# Patient Record
Sex: Female | Born: 1975
Health system: Southern US, Community
[De-identification: ages and names within clinical notes are randomized; demographics above are authoritative.]

## PROBLEM LIST (undated history)

## (undated) DIAGNOSIS — N200 Calculus of kidney: Secondary | ICD-10-CM

## (undated) HISTORY — PX: BREAST SURGERY: SHX581

## (undated) HISTORY — PX: ABDOMINAL HYSTERECTOMY: SHX81

## (undated) HISTORY — PX: LITHOTRIPSY: SUR834

---

## 1998-07-27 ENCOUNTER — Encounter: Payer: Self-pay | Admitting: Emergency Medicine

## 1998-07-27 ENCOUNTER — Emergency Department (HOSPITAL_COMMUNITY): Admission: EM | Admit: 1998-07-27 | Discharge: 1998-07-27 | Payer: Self-pay

## 1998-09-04 ENCOUNTER — Other Ambulatory Visit: Admission: RE | Admit: 1998-09-04 | Discharge: 1998-09-04 | Payer: Self-pay | Admitting: Obstetrics

## 1998-11-02 ENCOUNTER — Emergency Department (HOSPITAL_COMMUNITY): Admission: EM | Admit: 1998-11-02 | Discharge: 1998-11-02 | Payer: Self-pay | Admitting: Emergency Medicine

## 1999-03-20 ENCOUNTER — Emergency Department (HOSPITAL_COMMUNITY): Admission: EM | Admit: 1999-03-20 | Discharge: 1999-03-20 | Payer: Self-pay | Admitting: Emergency Medicine

## 1999-07-19 ENCOUNTER — Emergency Department (HOSPITAL_COMMUNITY): Admission: EM | Admit: 1999-07-19 | Discharge: 1999-07-19 | Payer: Self-pay | Admitting: Emergency Medicine

## 1999-08-10 ENCOUNTER — Ambulatory Visit (HOSPITAL_COMMUNITY): Admission: RE | Admit: 1999-08-10 | Discharge: 1999-08-10 | Payer: Self-pay | Admitting: Family Medicine

## 1999-08-10 ENCOUNTER — Encounter: Payer: Self-pay | Admitting: Family Medicine

## 1999-11-15 ENCOUNTER — Encounter: Payer: Self-pay | Admitting: Emergency Medicine

## 1999-11-15 ENCOUNTER — Emergency Department (HOSPITAL_COMMUNITY): Admission: EM | Admit: 1999-11-15 | Discharge: 1999-11-15 | Payer: Self-pay | Admitting: Emergency Medicine

## 2001-09-19 ENCOUNTER — Emergency Department (HOSPITAL_COMMUNITY): Admission: EM | Admit: 2001-09-19 | Discharge: 2001-09-19 | Payer: Self-pay | Admitting: Emergency Medicine

## 2002-04-15 ENCOUNTER — Emergency Department (HOSPITAL_COMMUNITY): Admission: EM | Admit: 2002-04-15 | Discharge: 2002-04-15 | Payer: Self-pay | Admitting: Emergency Medicine

## 2002-08-29 ENCOUNTER — Emergency Department (HOSPITAL_COMMUNITY): Admission: EM | Admit: 2002-08-29 | Discharge: 2002-08-29 | Payer: Self-pay | Admitting: Emergency Medicine

## 2003-02-23 ENCOUNTER — Emergency Department (HOSPITAL_COMMUNITY): Admission: EM | Admit: 2003-02-23 | Discharge: 2003-02-23 | Payer: Self-pay | Admitting: Emergency Medicine

## 2003-03-14 ENCOUNTER — Other Ambulatory Visit: Admission: RE | Admit: 2003-03-14 | Discharge: 2003-03-14 | Payer: Self-pay | Admitting: Obstetrics and Gynecology

## 2003-09-19 ENCOUNTER — Emergency Department (HOSPITAL_COMMUNITY): Admission: EM | Admit: 2003-09-19 | Discharge: 2003-09-19 | Payer: Self-pay | Admitting: Emergency Medicine

## 2003-10-30 ENCOUNTER — Ambulatory Visit (HOSPITAL_COMMUNITY): Admission: RE | Admit: 2003-10-30 | Discharge: 2003-10-30 | Payer: Self-pay | Admitting: Obstetrics and Gynecology

## 2006-01-28 ENCOUNTER — Emergency Department: Payer: Self-pay | Admitting: Emergency Medicine

## 2006-01-30 ENCOUNTER — Ambulatory Visit: Payer: Self-pay | Admitting: Emergency Medicine

## 2006-05-12 ENCOUNTER — Emergency Department: Payer: Self-pay | Admitting: Emergency Medicine

## 2006-07-24 ENCOUNTER — Emergency Department: Payer: Self-pay | Admitting: Emergency Medicine

## 2006-10-29 ENCOUNTER — Emergency Department: Payer: Self-pay | Admitting: Emergency Medicine

## 2007-05-29 IMAGING — CT CT STONE STUDY
1 of 2 series · 15 of 32 positions shown, 19 images · non-contrast
Comparison: none

REASON FOR EXAM: flank pain       rm 1
COMMENTS:  LMP: > one month ago

[Series 2: soft tissue · axial · 0.63mm/px · z∈[-951,-579]mm · 15 of 141 slices shown, 19 images]
[im 11/141  soft-tissue]
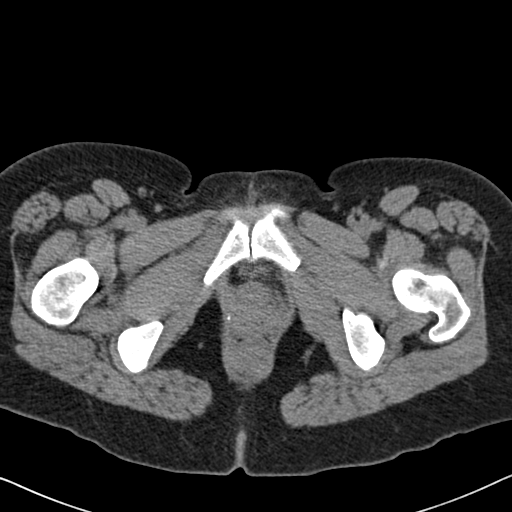
[im 11/141  bone]
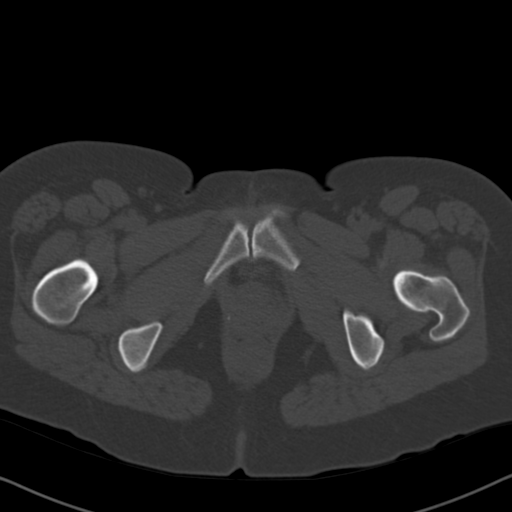
[im 21/141  soft-tissue]
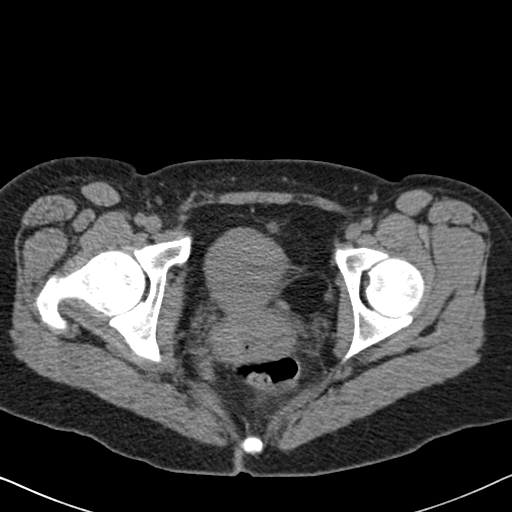
[im 32/141  soft-tissue]
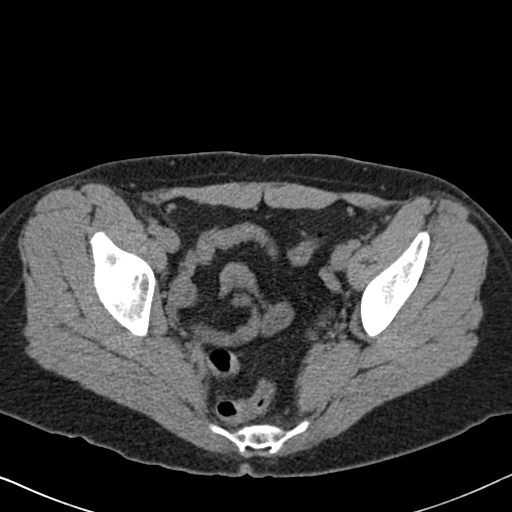
[im 42/141  soft-tissue]
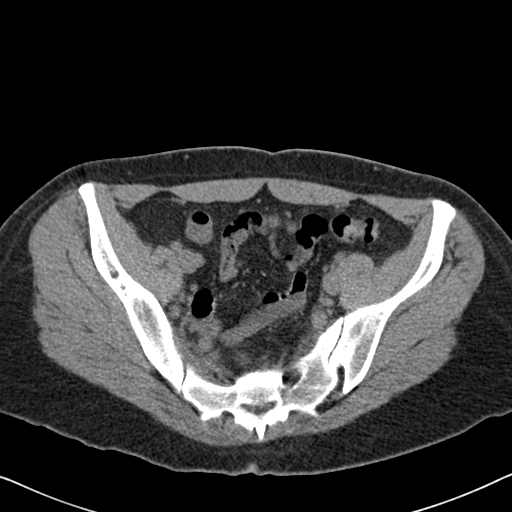
[im 52/141  soft-tissue]
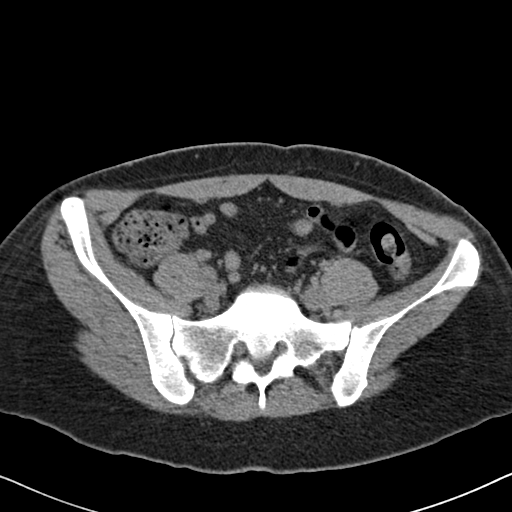
[im 63/141  soft-tissue]
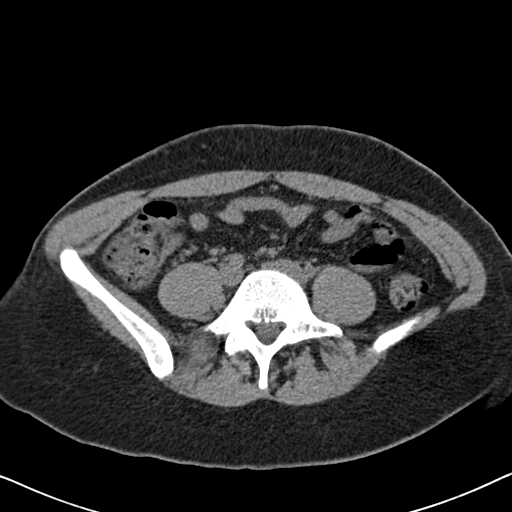
[im 73/141  soft-tissue]
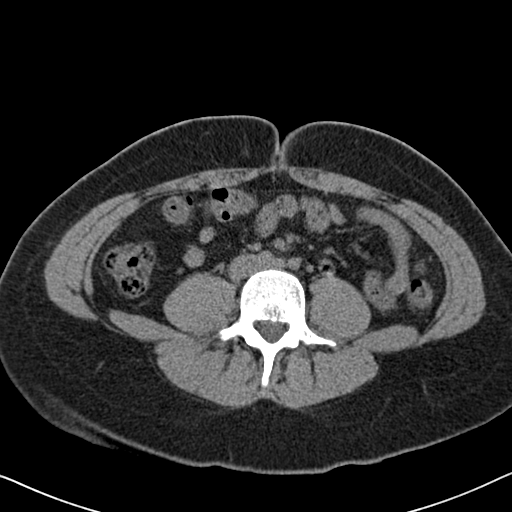
[im 83/141  soft-tissue]
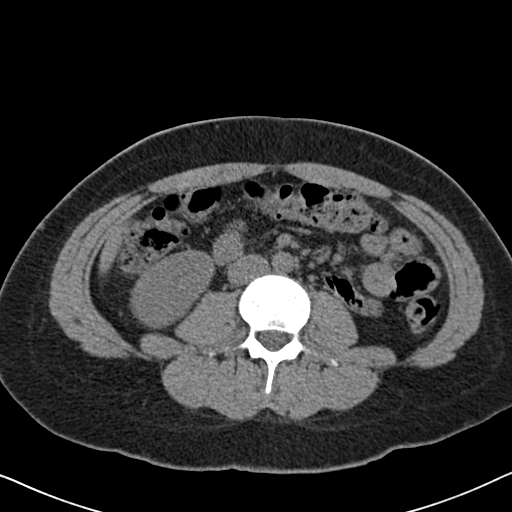
[im 94/141  soft-tissue]
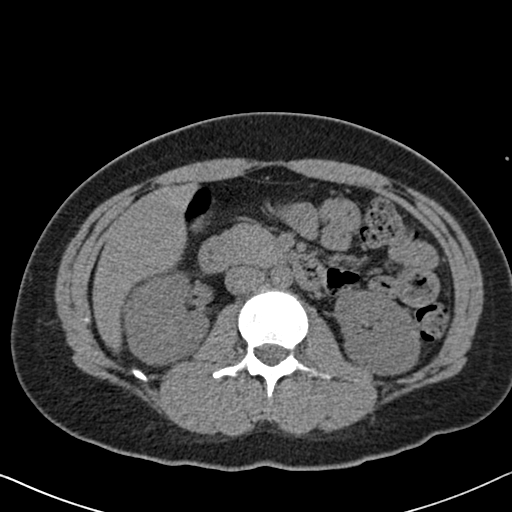
[im 94/141  bone]
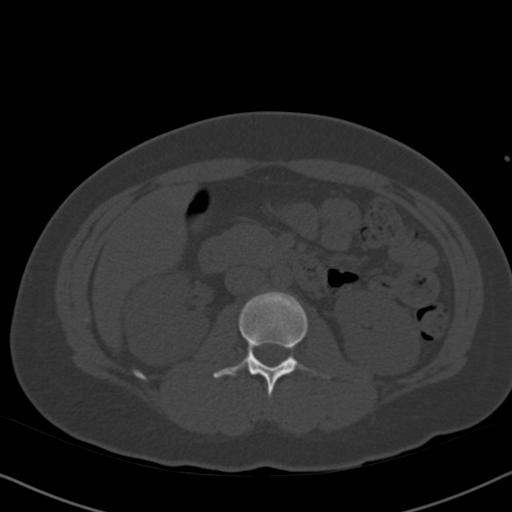
[im 104/141  soft-tissue]
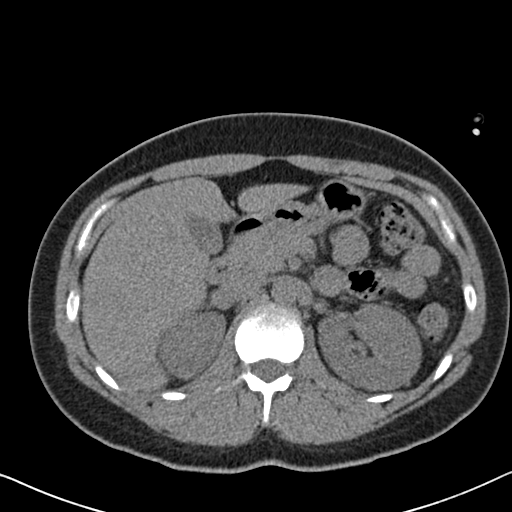
[im 115/141  soft-tissue]
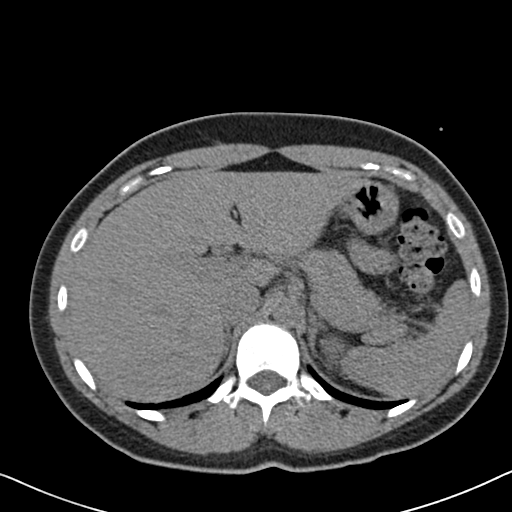
[im 120/141  lung]
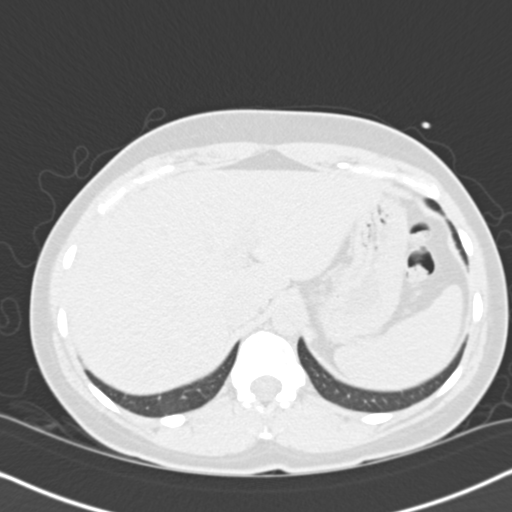
[im 125/141  soft-tissue]
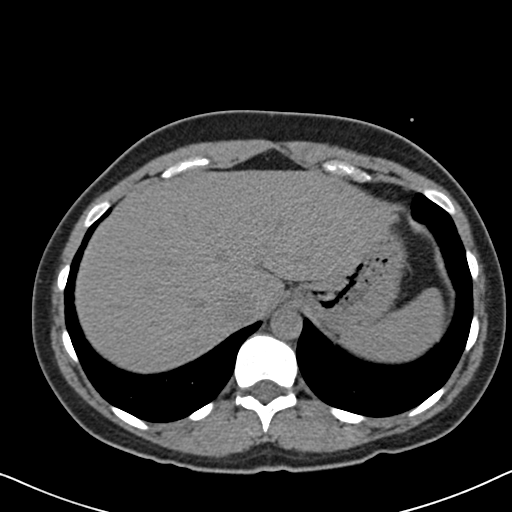
[im 125/141  lung]
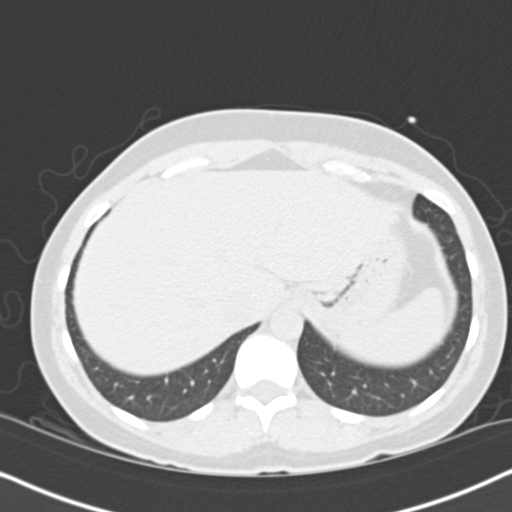
[im 130/141  lung]
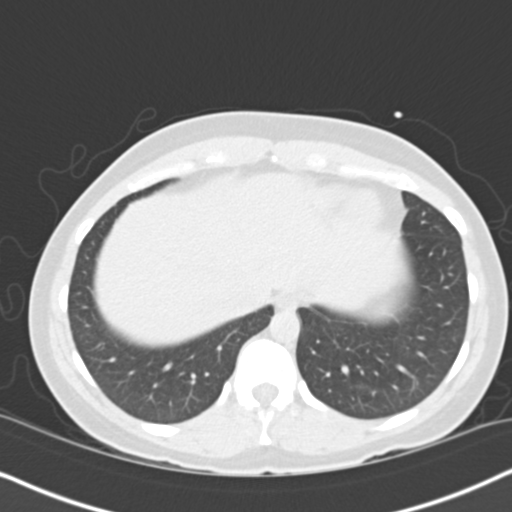
[im 135/141  soft-tissue]
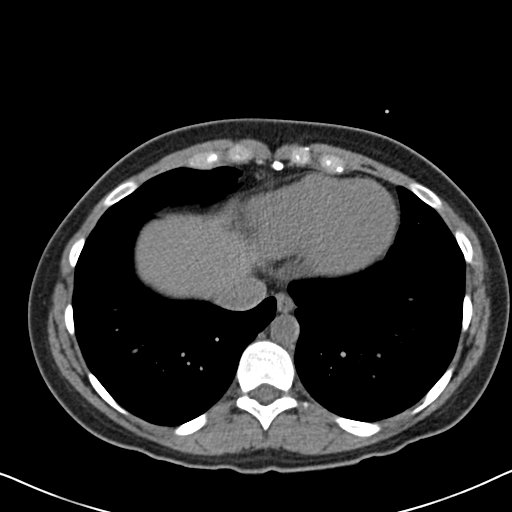
[im 135/141  lung]
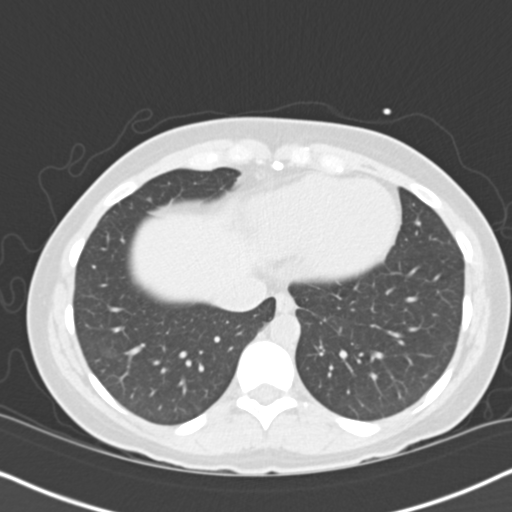

[15 of 32 positions shown; findings below may reference images not displayed]

PROCEDURE:     CT  - CT ABDOMEN /PELVIS WO (STONE)  - May 12, 2006 [DATE]

RESULT:     The patient is complaining of LEFT flank discomfort.  Comparison
is made to study 30 January, 2006.

Both RIGHT and LEFT kidneys are normal in contour. There are faint medullary
calcifications in both kidneys and there is a lower pole stone seen on image
#51 which was demonstrated on the prior study.  Neither kidney exhibits
hydronephrosis.  The perinephric fat is normal in appearance.  The
unopacified loops of small and large bowel are normal in appearance. There
is no free fluid in the abdomen or pelvis.  The uterus and adnexal
structures are grossly normal. The partially distended urinary bladder is
also normal in appearance. The periaortic and pericaval regions are normal
in appearance.

The liver, gallbladder, pancreas, spleen, adrenal glands, and non-distended
stomach are normal in appearance. The lung bases are clear.
IMPRESSION: 1)I do not see evidence of urinary tract obstruction currently.  There is an
approximately 2 to 3 mm diameter lower pole caliceal stone on the LEFT,
which was seen on the prior exam.  There are no findings to suggest
perinephric inflammatory change either.

2)I do not see evidence of bowel obstruction nor findings suspicious for
inflammatory changes.

3)There is no free fluid in the abdomen or pelvis.

4)There is no evidence of acute hepatobiliary disease.

The findings were called to the [HOSPITAL] the conclusion of
the study.

## 2008-01-07 ENCOUNTER — Ambulatory Visit: Payer: Self-pay | Admitting: Family Medicine

## 2008-01-24 ENCOUNTER — Encounter: Payer: Self-pay | Admitting: Family Medicine

## 2008-01-30 ENCOUNTER — Encounter: Payer: Self-pay | Admitting: Family Medicine

## 2008-02-29 ENCOUNTER — Encounter: Payer: Self-pay | Admitting: Family Medicine

## 2009-08-04 ENCOUNTER — Emergency Department: Payer: Self-pay | Admitting: Emergency Medicine

## 2009-08-08 ENCOUNTER — Other Ambulatory Visit: Payer: Self-pay | Admitting: Family Medicine

## 2010-10-31 HISTORY — PX: BREAST CYST EXCISION: SHX579

## 2010-11-01 ENCOUNTER — Emergency Department: Payer: Self-pay | Admitting: Emergency Medicine

## 2010-11-15 ENCOUNTER — Emergency Department: Payer: Self-pay | Admitting: Emergency Medicine

## 2011-03-30 ENCOUNTER — Ambulatory Visit: Payer: Self-pay | Admitting: Family

## 2011-03-31 ENCOUNTER — Ambulatory Visit: Payer: Self-pay | Admitting: Family

## 2011-06-23 ENCOUNTER — Ambulatory Visit: Payer: Self-pay | Admitting: General Surgery

## 2011-06-24 LAB — PATHOLOGY REPORT

## 2012-03-30 ENCOUNTER — Ambulatory Visit: Payer: Self-pay | Admitting: General Surgery

## 2012-04-15 IMAGING — US ULTRASOUND LEFT BREAST
1 series · 11 of 11 positions shown · non-contrast
Comparison: none

REASON FOR EXAM: LT BRST PAIN NIPPLE AREA YEARLY US IF NEEDED
COMMENTS:

PROCEDURE:     US  - US BREAST LEFT  - March 30, 2011  [DATE]
RESULT:     No dominant masses or cysts are noted.

[Series 1: ultrasound left breast · 11 of 11 slices shown]
[im 1/11]
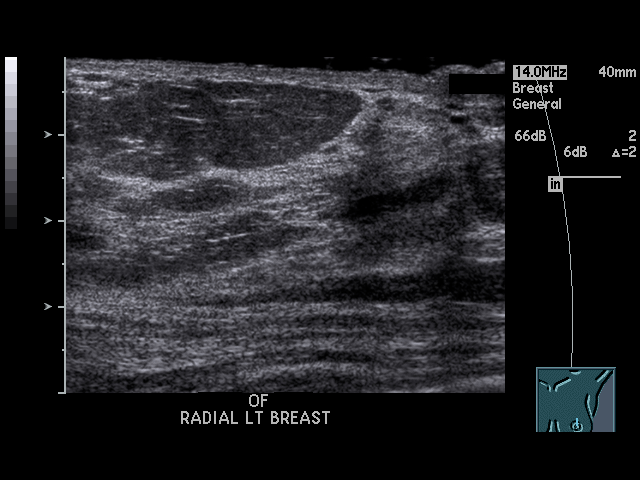
[im 2/11]
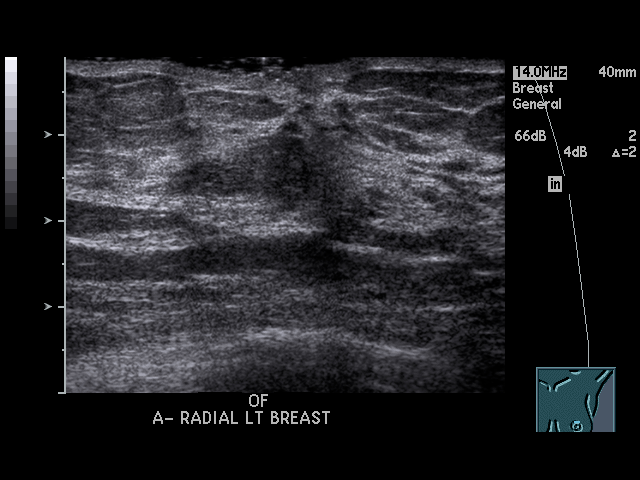
[im 3/11]
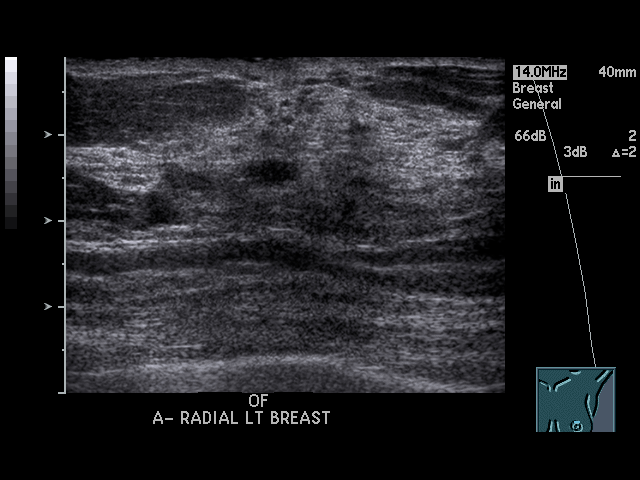
[im 4/11]
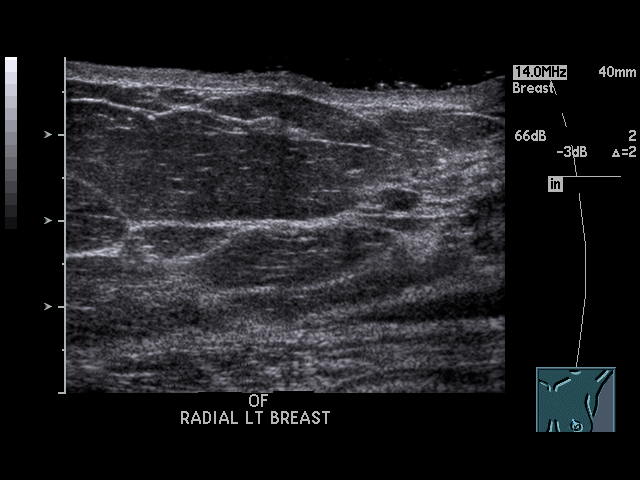
[im 5/11]
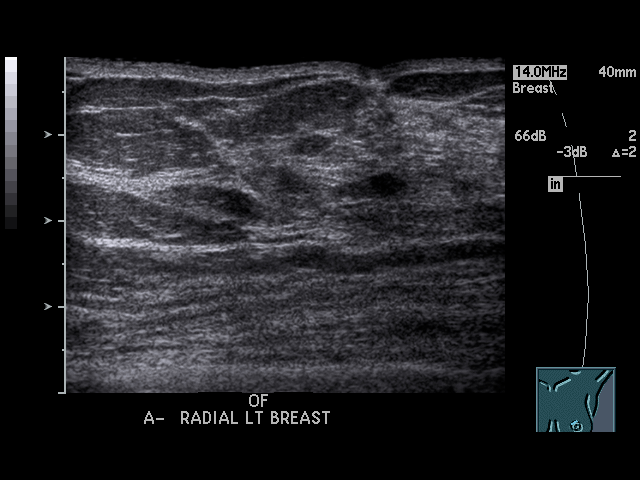
[im 6/11]
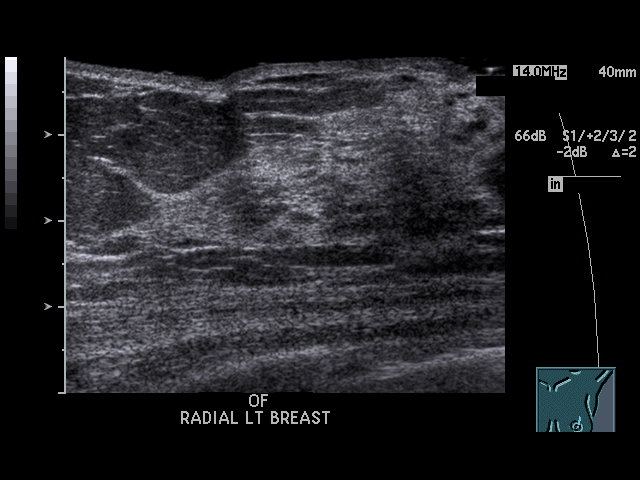
[im 7/11]
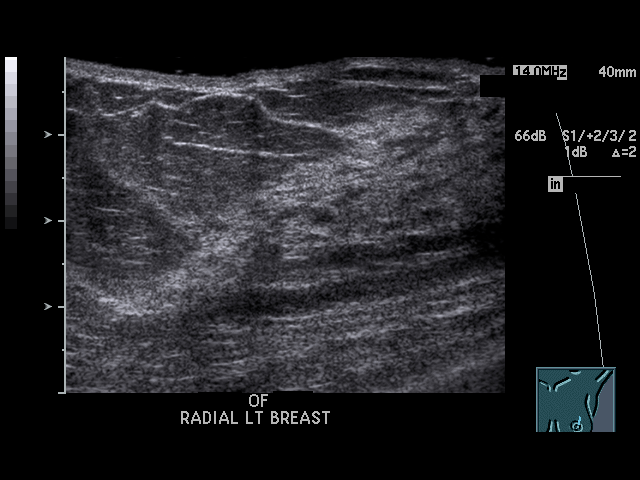
[im 8/11]
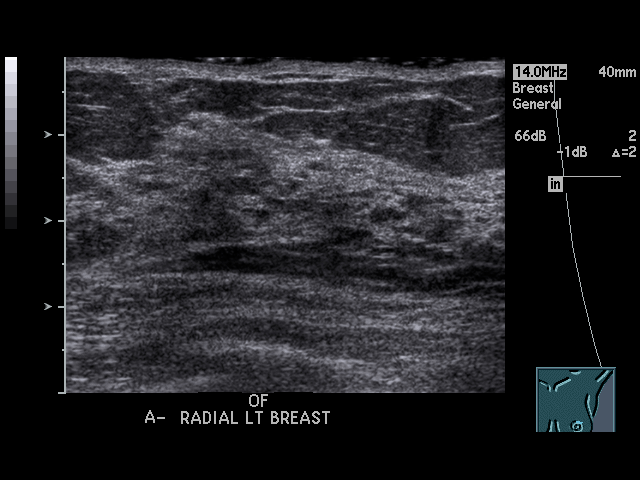
[im 9/11]
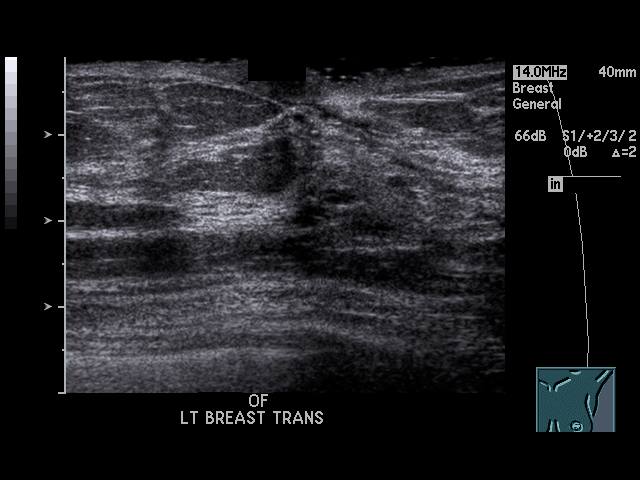
[im 10/11]
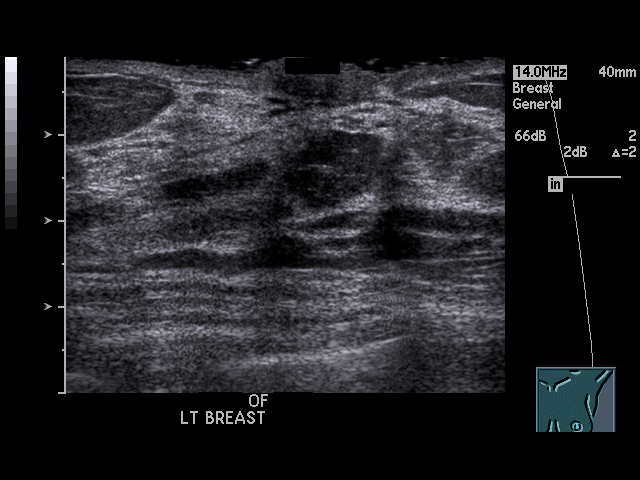
[im 11/11]
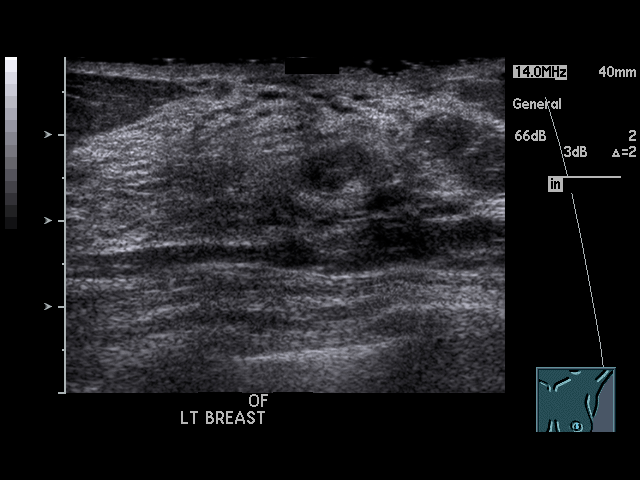

[11 of 11 positions shown; findings below may reference images not displayed]

IMPRESSION: Negative left breast ultrasound. Reference is made to mammogram report.

## 2012-04-15 IMAGING — MG MAM DGTL DIAGNOSTIC MAMMO W/CAD
1 series · 8 of 8 positions shown · non-contrast
Comparison: none

REASON FOR EXAM: LT BRST PAIN NIPPLE AREA YEARLY US IF NEEDED
COMMENTS:

[Series 448: R CC · right · 8 of 8 slices shown]
[im 1/8]
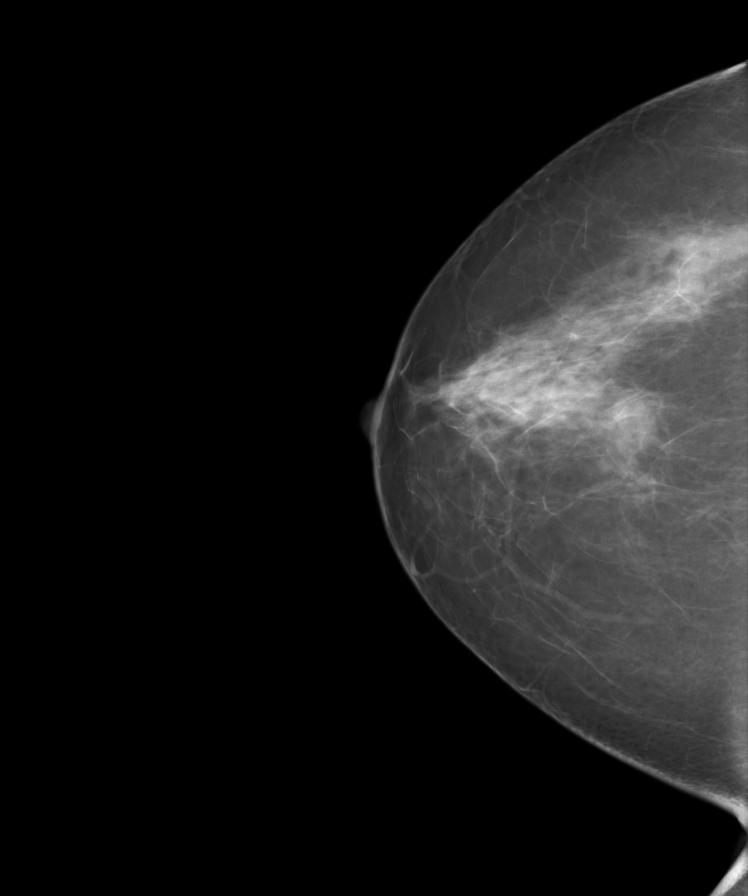
[im 2/8]
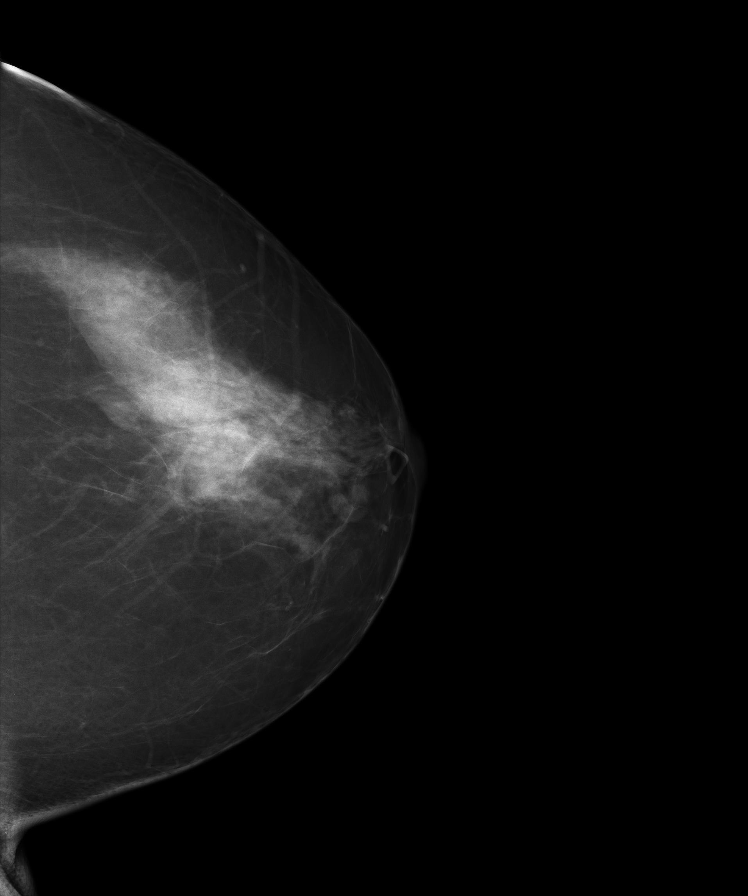
[im 3/8]
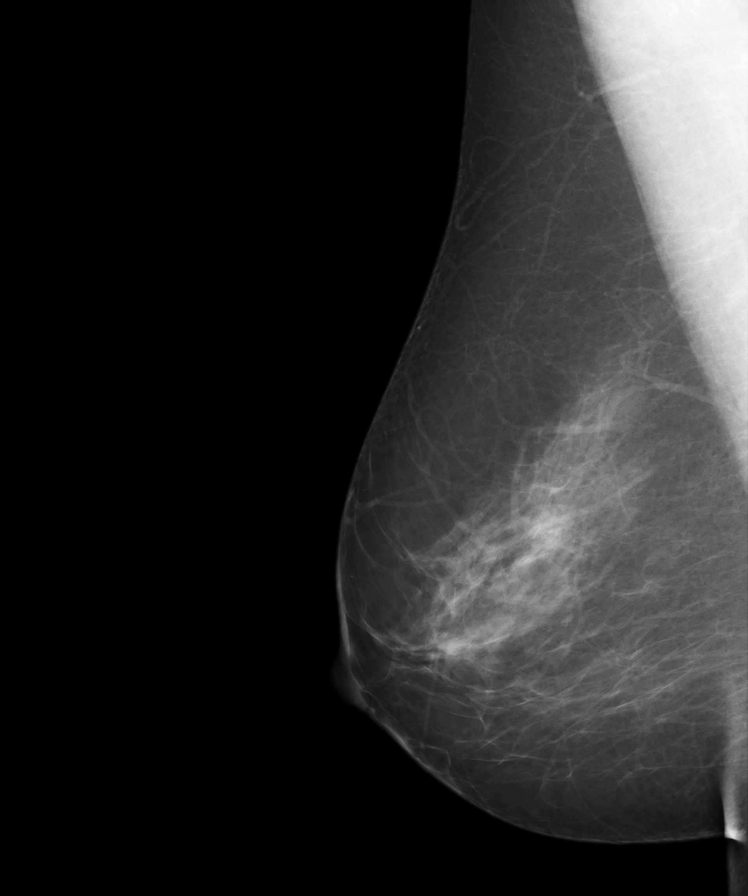
[im 4/8]
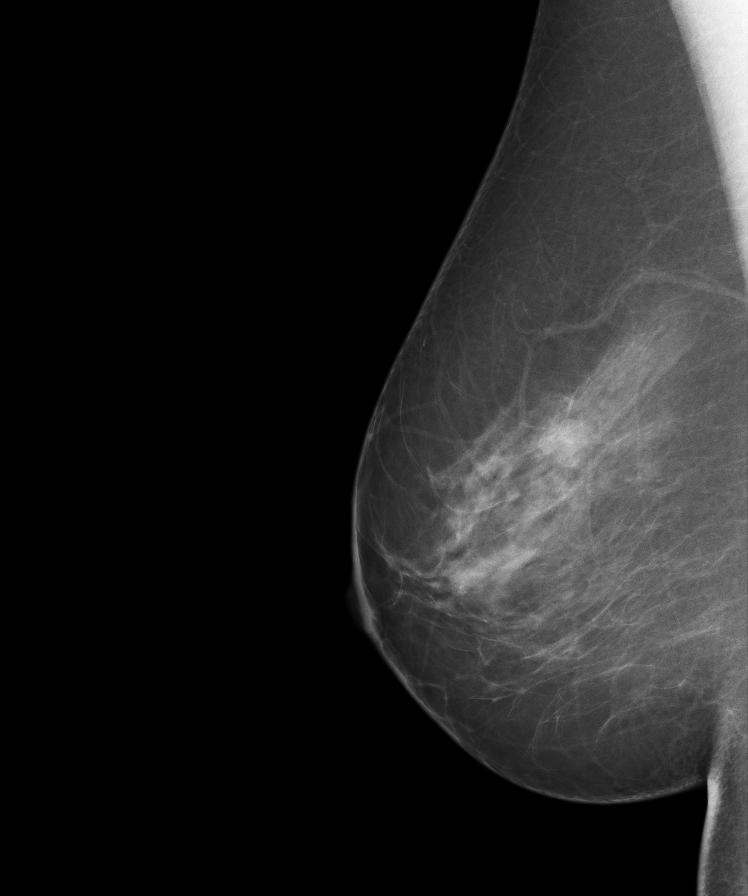
[im 5/8]
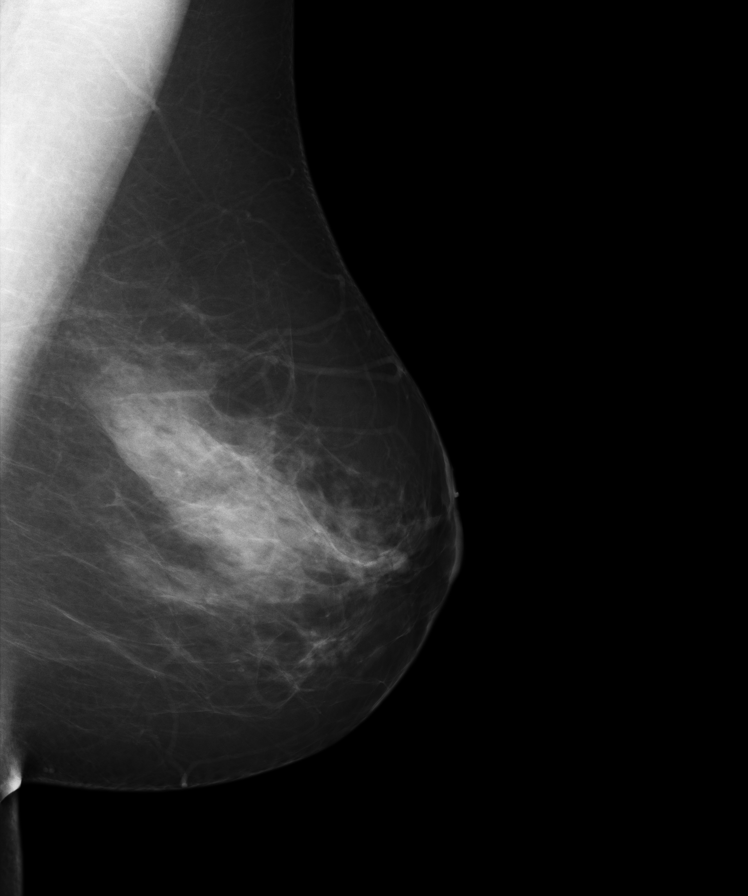
[im 6/8]
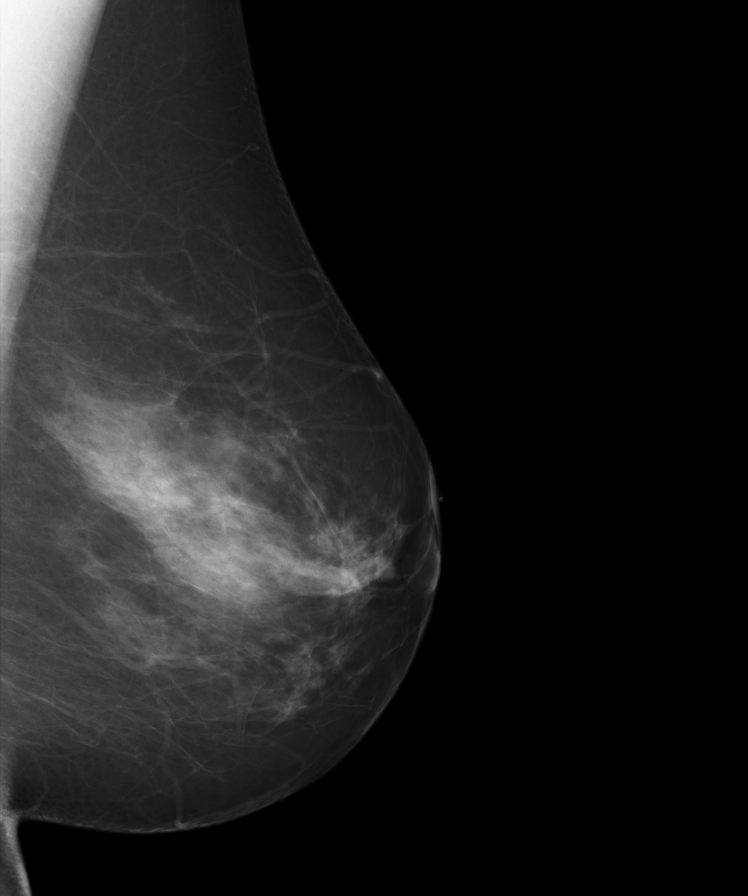
[im 7/8]
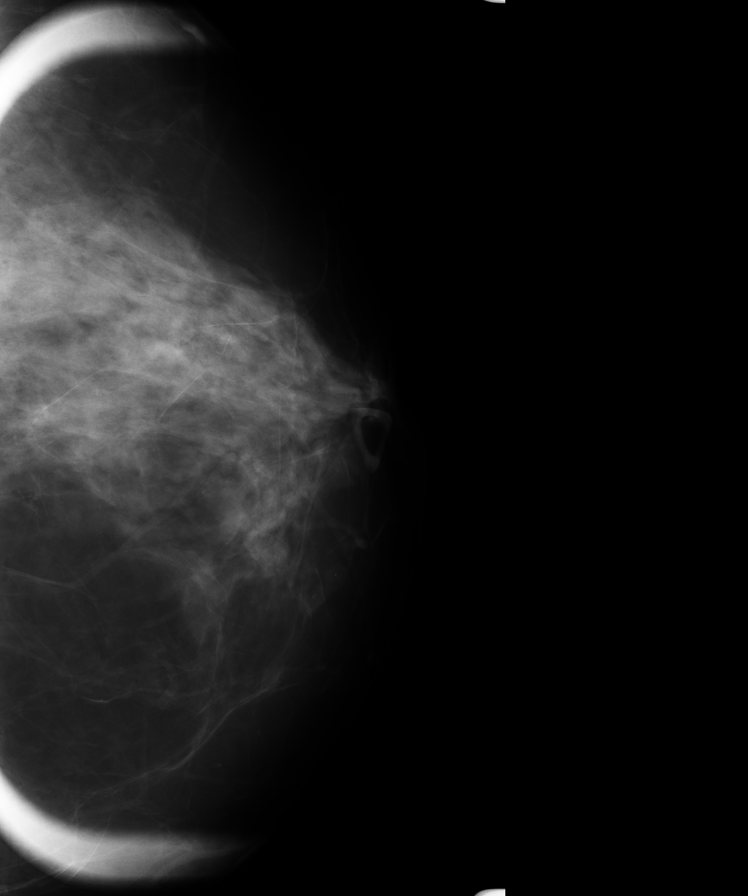
[im 8/8]
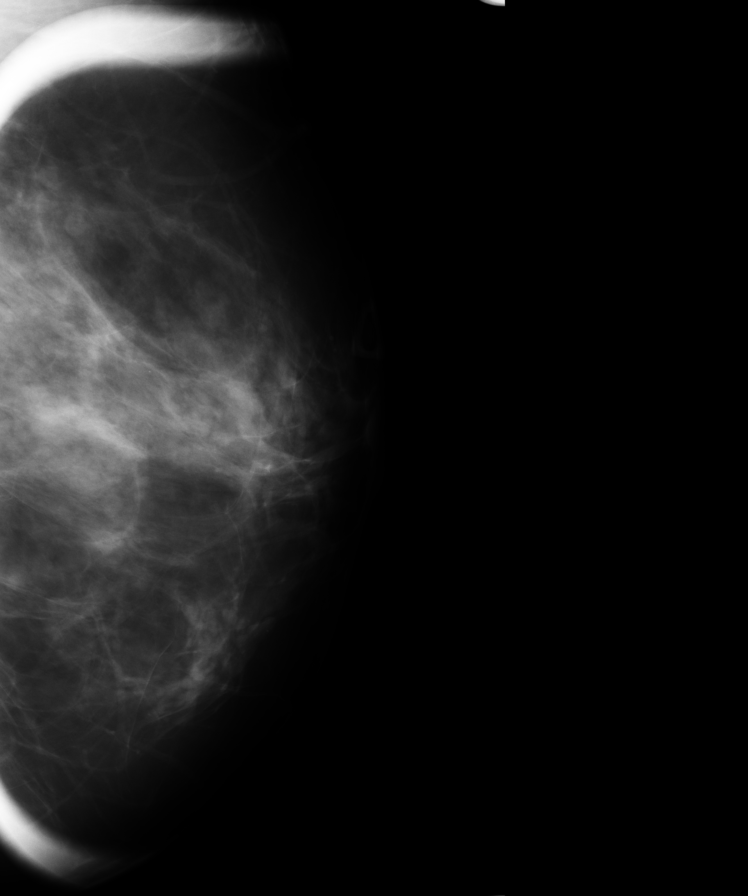

[8 of 8 positions shown; findings below may reference images not displayed]

PROCEDURE:     MAM - MAM DGTL DIAGNOSTIC MAMMO W/CAD  - March 30, 2011  [DATE]

RESULT:         No prior exams are available for comparison. A nodular
parenchymal pattern is present particularly in the retroareolar portion of
the left breast.  Compression spot films are performed and reveal no focal
abnormality suggesting that nodularity is mostly likely ductal.  Noted,
however, in the right breast in the central upper portion is a rounded
density. It is suggested that the patient return for compression spot films
and if need be ultrasound of the right breast.
IMPRESSION: BI-RADS:   Category 0-Needs Additional Imaging Evaluation.

A negative mammogram report does not preclude biopsy or other evaluation of
a clinically palpable or otherwise suspicious mass or lesion.  Breast cancer
may not be detected by mammography in up to 10% of cases.

## 2012-04-16 IMAGING — MG MAM DGTL [HOSPITAL] ADD VIEWS RT  DIAG
1 series · 2 of 2 positions shown · non-contrast
Comparison: none

REASON FOR EXAM: AV RT ROUNDED DENSITY
COMMENTS:

[Series 506: R CC · right · 2 of 2 slices shown]
[im 1/2]
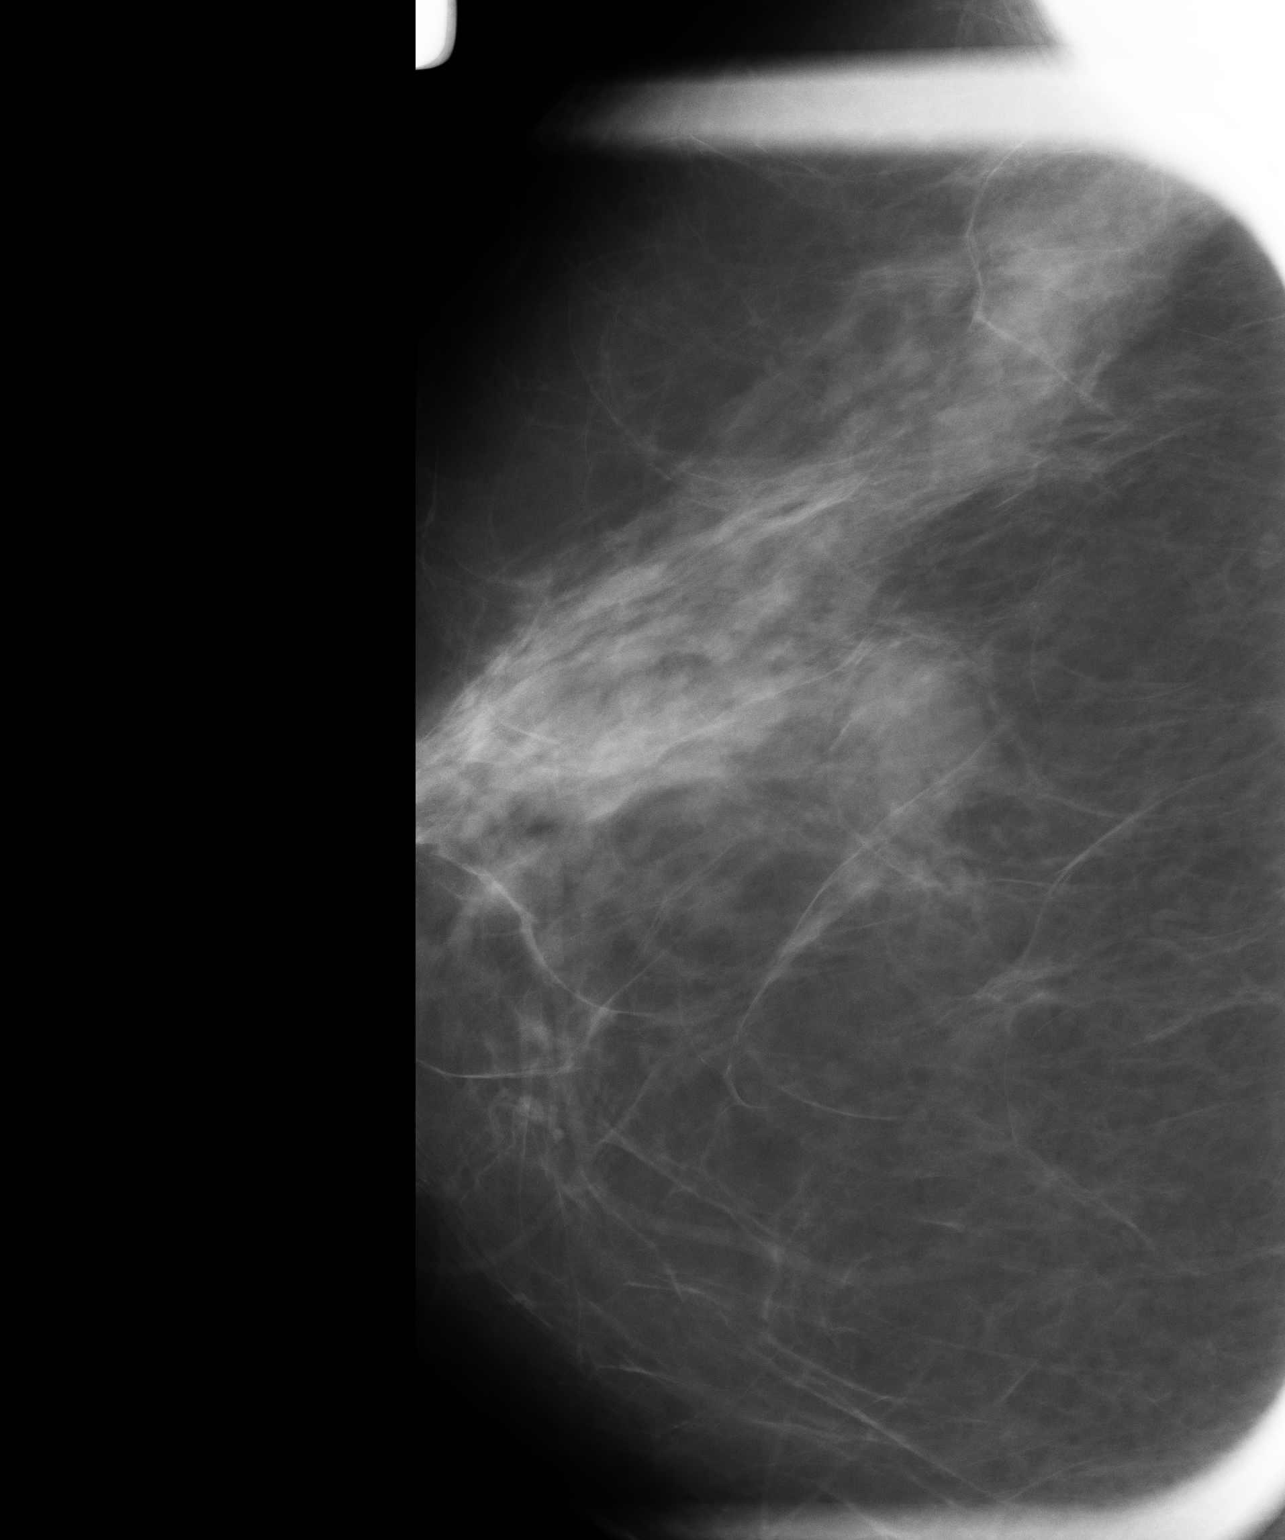
[im 2/2]
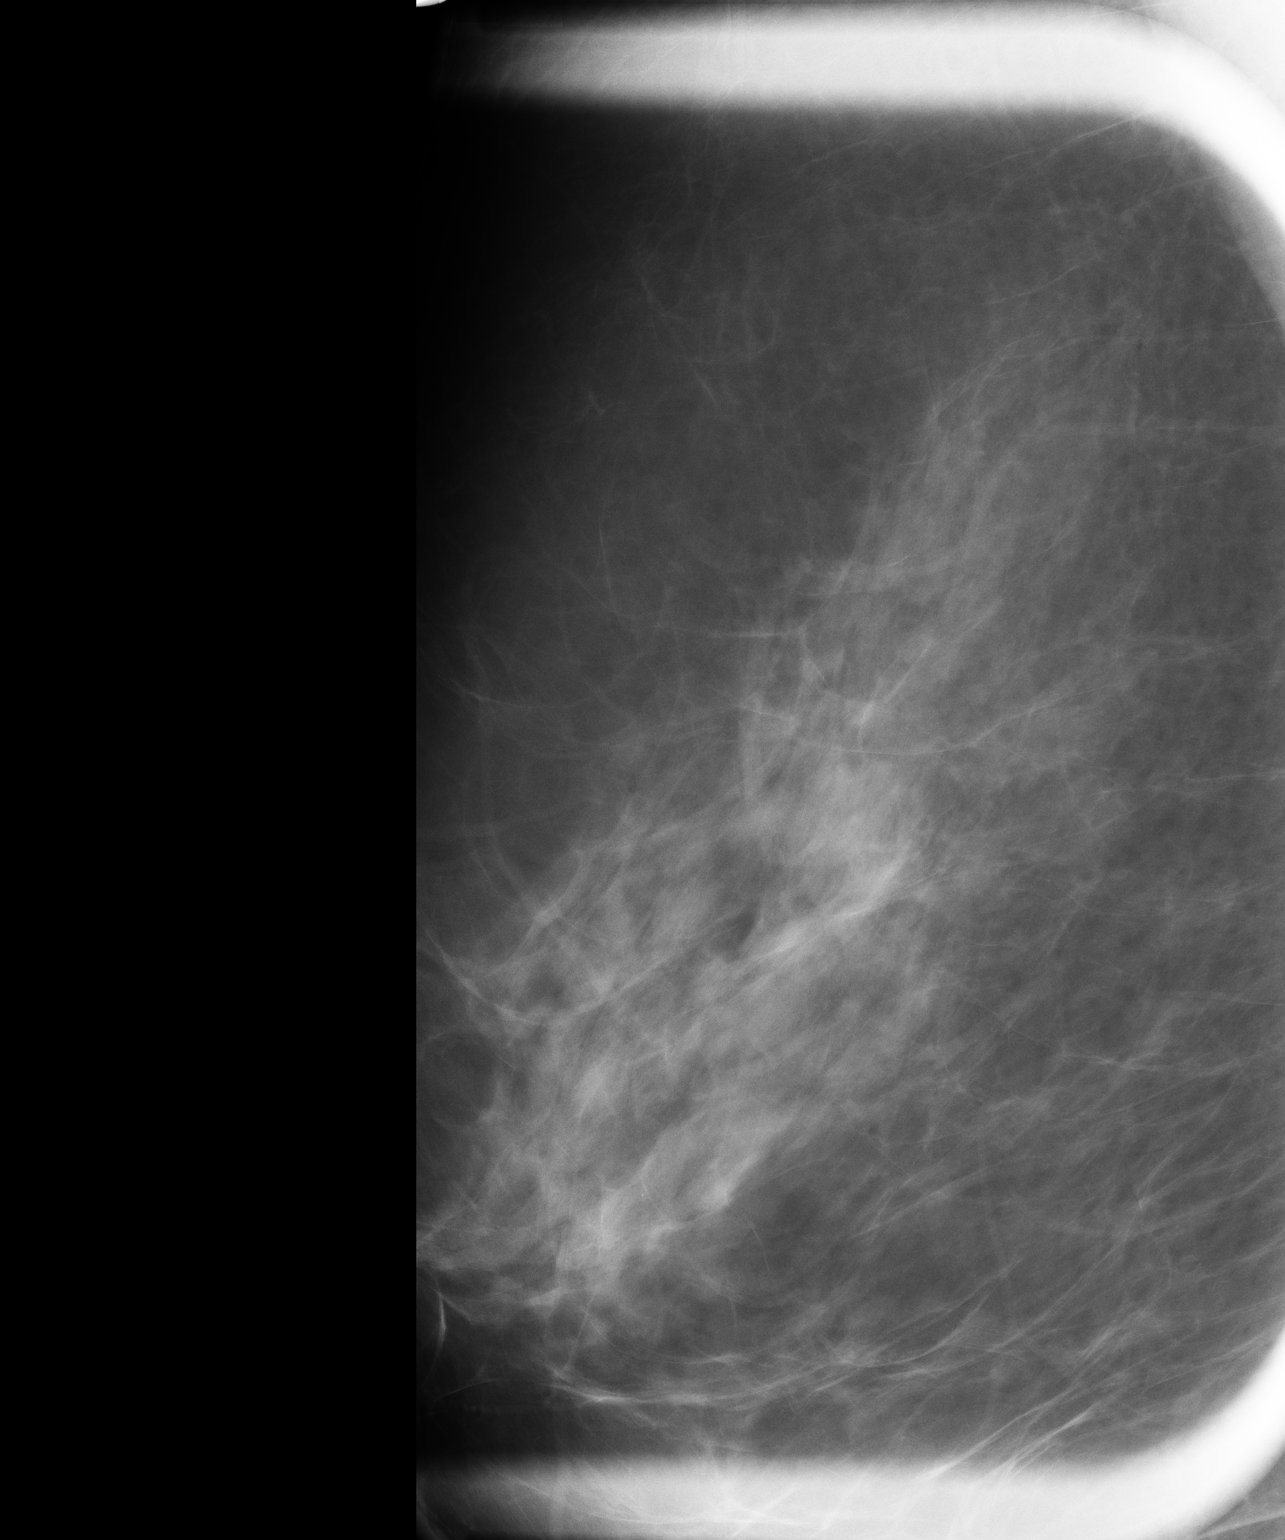

[2 of 2 positions shown; findings below may reference images not displayed]

PROCEDURE:     MAM - MAM DGTL [HOSPITAL] ADD VIEWS RT  DIAG  - March 31, 2011  [DATE]

RESULT:     Additional views of the right breast reveal persistent nodular
density in the upper portion of the right breast. Ultrasound was obtained
and reveals a 1.0 cm possible solid lesion in this region. It is suggested
that patient be considered for ultrasound-directed biopsy or needle
localization and surgical removal of this lesion as malignancy cannot be
excluded.
IMPRESSION: BIRADS:   Category 4-Suspicious Abnormality.

A negative mammogram report does not preclude biopsy or other evaluation of
a clinically palpable or otherwise suspicious mass or lesion.  Breast cancer
may not be detected by mammography in up to 10% of cases.

## 2013-08-16 ENCOUNTER — Ambulatory Visit: Payer: Self-pay | Admitting: Family Medicine

## 2013-10-01 ENCOUNTER — Ambulatory Visit: Payer: Self-pay | Admitting: Family Medicine

## 2013-10-20 ENCOUNTER — Emergency Department: Payer: Self-pay | Admitting: Emergency Medicine

## 2013-10-20 LAB — COMPREHENSIVE METABOLIC PANEL
Anion Gap: 5 — ABNORMAL LOW (ref 7–16)
BUN: 11 mg/dL (ref 7–18)
Bilirubin,Total: 0.2 mg/dL (ref 0.2–1.0)
Chloride: 108 mmol/L — ABNORMAL HIGH (ref 98–107)
Co2: 26 mmol/L (ref 21–32)
Creatinine: 0.87 mg/dL (ref 0.60–1.30)
EGFR (African American): >60
EGFR (Non-African Amer.): >60
Osmolality: 277 (ref 275–301)
SGOT(AST): 23 U/L (ref 15–37)
SGPT (ALT): 26 U/L (ref 12–78)
Total Protein: 7.1 g/dL (ref 6.4–8.2)

## 2013-10-20 LAB — URINALYSIS, COMPLETE
Bacteria: NONE SEEN
Bilirubin,UR: NEGATIVE
Glucose,UR: NEGATIVE mg/dL (ref 0–75)
Leukocyte Esterase: NEGATIVE
Nitrite: NEGATIVE
Ph: 8 (ref 4.5–8.0)
RBC,UR: 2 /HPF (ref 0–5)
Squamous Epithelial: 3

## 2013-10-20 LAB — CBC
Platelet: 295 10*3/uL (ref 150–440)
RDW: 12.5 % (ref 11.5–14.5)
WBC: 10.8 10*3/uL (ref 3.6–11.0)

## 2014-01-29 ENCOUNTER — Emergency Department: Payer: Self-pay | Admitting: Internal Medicine

## 2014-11-16 ENCOUNTER — Emergency Department: Payer: Self-pay | Admitting: Emergency Medicine

## 2014-11-16 LAB — COMPREHENSIVE METABOLIC PANEL
ANION GAP: 8 (ref 7–16)
AST: 16 U/L (ref 15–37)
Albumin: 3.6 g/dL (ref 3.4–5.0)
Alkaline Phosphatase: 66 U/L
BUN: 9 mg/dL (ref 7–18)
Bilirubin,Total: 0.2 mg/dL (ref 0.2–1.0)
CALCIUM: 8.9 mg/dL (ref 8.5–10.1)
CHLORIDE: 106 mmol/L (ref 98–107)
Co2: 26 mmol/L (ref 21–32)
Creatinine: 0.74 mg/dL (ref 0.60–1.30)
EGFR (African American): >60
EGFR (Non-African Amer.): >60
Glucose: 80 mg/dL (ref 65–99)
Osmolality: 277 (ref 275–301)
Potassium: 4.1 mmol/L (ref 3.5–5.1)
SGPT (ALT): 24 U/L
Sodium: 140 mmol/L (ref 136–145)
TOTAL PROTEIN: 7.2 g/dL (ref 6.4–8.2)

## 2014-11-16 LAB — URINALYSIS, COMPLETE
BILIRUBIN, UR: NEGATIVE
GLUCOSE, UR: NEGATIVE mg/dL (ref 0–75)
KETONE: NEGATIVE
Leukocyte Esterase: NEGATIVE
Nitrite: NEGATIVE
Ph: 6 (ref 4.5–8.0)
Protein: NEGATIVE
RBC,UR: 2 /HPF (ref 0–5)
SPECIFIC GRAVITY: 1.003 (ref 1.003–1.030)
WBC UR: 1 /HPF (ref 0–5)

## 2014-11-16 LAB — CBC
HCT: 45.7 % (ref 35.0–47.0)
HGB: 15.3 g/dL (ref 12.0–16.0)
MCH: 33.9 pg (ref 26.0–34.0)
MCHC: 33.4 g/dL (ref 32.0–36.0)
MCV: 101 fL — ABNORMAL HIGH (ref 80–100)
Platelet: 293 10*3/uL (ref 150–440)
RBC: 4.51 10*6/uL (ref 3.80–5.20)
RDW: 12.6 % (ref 11.5–14.5)
WBC: 9.4 10*3/uL (ref 3.6–11.0)

## 2014-12-23 ENCOUNTER — Emergency Department: Payer: Self-pay | Admitting: Emergency Medicine

## 2015-01-13 ENCOUNTER — Ambulatory Visit: Payer: Self-pay | Admitting: Obstetrics

## 2015-05-08 ENCOUNTER — Emergency Department: Payer: Self-pay

## 2015-05-08 ENCOUNTER — Encounter: Payer: Self-pay | Admitting: Emergency Medicine

## 2015-05-08 ENCOUNTER — Emergency Department
Admission: EM | Admit: 2015-05-08 | Discharge: 2015-05-08 | Discharge status: Against Medical Advice | Payer: Self-pay | Attending: Emergency Medicine | Admitting: Emergency Medicine

## 2015-05-08 DIAGNOSIS — R1011 Right upper quadrant pain: Secondary | ICD-10-CM | POA: Insufficient documentation

## 2015-05-08 DIAGNOSIS — G8918 Other acute postprocedural pain: Secondary | ICD-10-CM | POA: Insufficient documentation

## 2015-05-08 LAB — CBC WITH DIFFERENTIAL/PLATELET
BASOS PCT: 1 %
Basophils Absolute: 0.1 10*3/uL (ref 0–0.1)
EOS ABS: 0.4 10*3/uL (ref 0–0.7)
Eosinophils Relative: 4 %
HEMATOCRIT: 42.9 % (ref 35.0–47.0)
HEMOGLOBIN: 14.8 g/dL (ref 12.0–16.0)
LYMPHS ABS: 2.9 10*3/uL (ref 1.0–3.6)
Lymphocytes Relative: 29 %
MCH: 33.9 pg (ref 26.0–34.0)
MCHC: 34.4 g/dL (ref 32.0–36.0)
MCV: 98.6 fL (ref 80.0–100.0)
MONOS PCT: 12 %
Monocytes Absolute: 1.2 10*3/uL — ABNORMAL HIGH (ref 0.2–0.9)
Neutro Abs: 5.6 10*3/uL (ref 1.4–6.5)
Neutrophils Relative %: 54 %
Platelets: 318 10*3/uL (ref 150–440)
RBC: 4.36 MIL/uL (ref 3.80–5.20)
RDW: 12.9 % (ref 11.5–14.5)
WBC: 10.2 10*3/uL (ref 3.6–11.0)

## 2015-05-08 LAB — COMPREHENSIVE METABOLIC PANEL
ALT: 14 U/L (ref 14–54)
ANION GAP: 6 (ref 5–15)
AST: 16 U/L (ref 15–41)
Albumin: 3.8 g/dL (ref 3.5–5.0)
Alkaline Phosphatase: 55 U/L (ref 38–126)
BUN: 9 mg/dL (ref 6–20)
CALCIUM: 7.9 mg/dL — AB (ref 8.9–10.3)
CO2: 26 mmol/L (ref 22–32)
CREATININE: 0.56 mg/dL (ref 0.44–1.00)
Chloride: 106 mmol/L (ref 101–111)
GFR calc Af Amer: >60 mL/min (ref >60)
GFR calc non Af Amer: >60 mL/min (ref >60)
Glucose, Bld: 97 mg/dL (ref 65–99)
Potassium: 4.1 mmol/L (ref 3.5–5.1)
SODIUM: 138 mmol/L (ref 135–145)
TOTAL PROTEIN: 6.9 g/dL (ref 6.5–8.1)
Total Bilirubin: 0.5 mg/dL (ref 0.3–1.2)

## 2015-05-08 LAB — URINALYSIS COMPLETE WITH MICROSCOPIC (ARMC ONLY)
BILIRUBIN URINE: NEGATIVE
Glucose, UA: NEGATIVE mg/dL
Ketones, ur: NEGATIVE mg/dL
Leukocytes, UA: NEGATIVE
Nitrite: NEGATIVE
PROTEIN: NEGATIVE mg/dL
SPECIFIC GRAVITY, URINE: 1.005 (ref 1.005–1.030)
pH: 7 (ref 5.0–8.0)

## 2015-05-08 LAB — TROPONIN I: Troponin I: <0.03 ng/mL (ref <0.031)

## 2015-05-08 LAB — LIPASE, BLOOD: LIPASE: 30 U/L (ref 22–51)

## 2015-05-08 LAB — FIBRIN DERIVATIVES D-DIMER (ARMC ONLY): Fibrin derivatives D-dimer (ARMC): <0.61 (ref 0–499)

## 2015-05-08 MED ORDER — ONDANSETRON HCL 4 MG/2ML IJ SOLN
4.0000 mg | Freq: Once | INTRAMUSCULAR | Status: AC
  Administered 2015-05-08: 4 mg via INTRAVENOUS

## 2015-05-08 MED ORDER — MORPHINE SULFATE 4 MG/ML IJ SOLN
4.0000 mg | Freq: Once | INTRAMUSCULAR | Status: AC
  Administered 2015-05-08: 4 mg via INTRAVENOUS

## 2015-05-08 MED ORDER — IOHEXOL 240 MG/ML SOLN
25.0000 mL | INTRAMUSCULAR | Status: AC
  Administered 2015-05-08: 25 mL via ORAL

## 2015-05-08 MED ORDER — IOHEXOL 300 MG/ML  SOLN
100.0000 mL | Freq: Once | INTRAMUSCULAR | Status: AC | PRN
  Administered 2015-05-08: 100 mL via INTRAVENOUS

## 2015-05-08 MED ORDER — SODIUM CHLORIDE 0.9 % IV BOLUS (SEPSIS)
1000.0000 mL | Freq: Once | INTRAVENOUS | Status: AC
  Administered 2015-05-08: 1000 mL via INTRAVENOUS

## 2015-05-08 NOTE — ED Provider Notes (Signed)
Assumed care CT of the abdomen comes back essentially normal. Patient reports the pain is in the right side of the upper abdomen them in the lower chest actually over the ribs toward the bottom of the ribs. Patient also reports it's worse when she takes a deep breath and worse when she moves. She also feels short of breath when she walks. Patient's mother had several blood clots. Patient's mother's blood clots seem to be associated with cancer that she had. Patient is status post hysterectomy will do a d-dimer although that there is a good chance that that will be positive. Probably will have to do a CT angiogram.  Arnaldo NatalPaul F Kem Hensen, MD 05/08/15 [phone]

## 2015-05-08 NOTE — ED Notes (Signed)
Pt presents with right side rib pain. Pt reports having a lap hysterectomy back on June 30th. Has been hurting for four days, worse when taking a deep breath.

## 2015-05-08 NOTE — ED Notes (Signed)
Pt informed to return if any life threatening symptoms occur.  

## 2015-05-08 NOTE — ED Provider Notes (Signed)
[facility] Emergency Department Provider Note  Time seen: 11:28 AM  I have reviewed the triage vital signs and the nursing notes.   HISTORY  Chief Complaint Post-op Problem    HPI Tina Kemp is a 39 y.o. female who presents the emergency department with right upper quadrant pain. According to the patient she had a laparoscopic hyste[facility] on 04/30/15 [facility]. She states her pain had been doing better but approximately 4 days ago it began worsening especially in the right upper quadrant. She also states nausea, and somewhat increased pain when she attempts to eat. Denies any alcohol use. Denies fever, diarrhea, or constipation. Her last bowel movement was yesterday and normal. Patient describes her pain as moderate in severity, aching in the right upper quadrant.    History reviewed. No pertinent past medical history.  There are no active problems to display for this patient.   Past Surgical History  Procedure Laterality Date  . Abdominal hysterectomy      No current outpatient prescriptions on file.  Allergies Tramadol  No family history on file.  Social History History  Substance Use Topics  . Smoking status: Never Smoker   . Smokeless tobacco: Not on file  . Alcohol Use: No    Review of Systems Constitutional: Negative for fever. Cardiovascular: Negative for chest pain. Respiratory: Negative for shortness of breath. Gastrointestinal: Positive for right upper quadrant pain. Genitourinary: Negative for dysuria. Neurological: Negative for headache 10-point ROS otherwise negative.  ____________________________________________   PHYSICAL EXAM:  VITAL SIGNS: ED Triage Vitals  Enc Vitals Group     BP 05/08/15 1111 129/70 mmHg     Pulse Rate 05/08/15 1111 80     Resp 05/08/15 1111 18     Temp 05/08/15 1111 98.5 F (36.9 C)     Temp Source 05/08/15 1111 Oral     SpO2 05/08/15 1111 98 %     Weight 05/08/15 1111 170 lb (77.111 kg)      Height 05/08/15 1111 5\' 2"  (1.575 m)     Head Cir --      Peak Flow --      Pain Score 05/08/15 1113 8     Pain Loc --      Pain Edu? --      Excl. in GC? --     Constitutional: Alert and oriented. Well appearing and in no distress. Eyes: Normal exam ENT   Mouth/Throat: Mucous membranes are moist. Cardiovascular: Normal rate, regular rhythm. No murmur Respiratory: Normal respiratory effort without tachypnea nor retractions. Breath sounds are clear and equal bilaterally.  Gastrointestinal: Soft, moderate right upper quadrant tenderness palpation. No rebound or guarding. No distention. Well appearing/healing laparoscopic scars. Minimal lower abdominal tenderness. Musculoskeletal: Nontender with normal range of motion in all extremities.  Neurologic:  Normal speech and language. No gross focal neurologic deficits Skin:  Skin is warm, dry and intact.  Psychiatric: Mood and affect are normal. Speech and behavior are normal.   ____________________________________________     RADIOLOGY  Ultrasound largely within normal limits.  ____________________________________________    INITIAL IMPRESSION / ASSESSMENT AND PLAN / ED COURSE  Pertinent labs & imaging results that were available during my care of the patient were reviewed by me and considered in my medical decision making (see chart for details).  Patient with right upper quadrant pain 4 days, somewhat worse when eating. We will check labs, ultrasound to help further evaluate. Patient is approximately 9 days status post laparoscopic hysterectomy at  [facility].  Ultrasound largely within normal limits, labs largely within normal limits. Given the patient's continued pain we'll proceed with a CT scan of her abdomen and pelvis to further evaluate.  CT currently pending. Patient continues to appear very well. Patient care signed out to oncoming physician Dr. Darnelle Catalan.  Current plan if CT is normal the patient will be discharged home  with OB follow-up. If CT is abnormal we will discuss results with [facility].  ____________________________________________   FINAL CLINICAL IMPRESSION(S) / ED DIAGNOSES  Right upper quadrant pain.   Minna Antis, MD 05/08/15 [phone]

## 2015-05-08 NOTE — ED Notes (Signed)
Pt came to desk requesting to sign to leave. Pt was recommended to speak with MD prior to leaving. Pt demands to leave and reports she will follow up with pcp. Pt refuses to wait any longer. AMA form signed and MD made aware that pt left.

## 2015-05-08 NOTE — ED Notes (Signed)
Pt expressed frustration regarding wanting to go home. RN explained to pt that we are awaiting the results of lab work in order to investigate presence abnormalities.

## 2015-05-09 MED ORDER — PROMETHAZINE HCL 25 MG/ML IJ SOLN
INTRAMUSCULAR | Status: AC
  Filled 2015-05-09: qty 1

## 2015-05-09 MED ORDER — HYDROMORPHONE HCL 1 MG/ML IJ SOLN
INTRAMUSCULAR | Status: AC
  Filled 2015-05-09: qty 1

## 2015-11-03 ENCOUNTER — Encounter: Payer: Self-pay | Admitting: *Deleted

## 2015-11-03 ENCOUNTER — Emergency Department: Payer: 59

## 2015-11-03 ENCOUNTER — Emergency Department
Admission: EM | Admit: 2015-11-03 | Discharge: 2015-11-03 | Disposition: A | Discharge status: Home / Self Care | Payer: 59 | Attending: Emergency Medicine | Admitting: Emergency Medicine

## 2015-11-03 DIAGNOSIS — F17210 Nicotine dependence, cigarettes, uncomplicated: Secondary | ICD-10-CM | POA: Insufficient documentation

## 2015-11-03 DIAGNOSIS — R05 Cough: Secondary | ICD-10-CM | POA: Diagnosis present

## 2015-11-03 DIAGNOSIS — J069 Acute upper respiratory infection, unspecified: Secondary | ICD-10-CM | POA: Diagnosis not present

## 2015-11-03 MED ORDER — AZITHROMYCIN 250 MG PO TABS: ORAL_TABLET | ORAL | Status: DC

## 2015-11-03 MED ORDER — PSEUDOEPHEDRINE HCL 60 MG PO TABS: 60.0000 mg | ORAL_TABLET | ORAL | Status: DC | PRN

## 2015-11-03 MED ORDER — GUAIFENESIN-CODEINE 100-10 MG/5ML PO SOLN: 10.0000 mL | ORAL | Status: DC | PRN

## 2015-11-03 NOTE — Discharge Instructions (Signed)
Upper Respiratory Infection, Adult Most upper respiratory infections (URIs) are a viral infection of the air passages leading to the lungs. A URI affects the nose, throat, and upper air passages. The most common type of URI is nasopharyngitis and is typically referred to as "the common cold." URIs run their course and usually go away on their own. Most of the time, a URI does not require medical attention, but sometimes a bacterial infection in the upper airways can follow a viral infection. This is called a secondary infection. Sinus and middle ear infections are common types of secondary upper respiratory infections. Bacterial pneumonia can also complicate a URI. A URI can worsen asthma and chronic obstructive pulmonary disease (COPD). Sometimes, these complications can require emergency medical care and may be life threatening.  CAUSES Almost all URIs are caused by viruses. A virus is a type of germ and can spread from one person to another.  RISKS FACTORS You may be at risk for a URI if:   You smoke.   You have chronic heart or lung disease.  You have a weakened defense (immune) system.   You are very young or very old.   You have nasal allergies or asthma.  You work in crowded or poorly ventilated areas.  You work in health care facilities or schools. SIGNS AND SYMPTOMS  Symptoms typically develop 2-3 days after you come in contact with a cold virus. Most viral URIs last 7-10 days. However, viral URIs from the influenza virus (flu virus) can last 14-18 days and are typically more severe. Symptoms may include:   Runny or stuffy (congested) nose.   Sneezing.   Cough.   Sore throat.   Headache.   Fatigue.   Fever.   Loss of appetite.   Pain in your forehead, behind your eyes, and over your cheekbones (sinus pain).  Muscle aches.  DIAGNOSIS  Your health care provider may diagnose a URI by:  Physical exam.  Tests to check that your symptoms are not due to  another condition such as:  Strep throat.  Sinusitis.  Pneumonia.  Asthma. TREATMENT  A URI goes away on its own with time. It cannot be cured with medicines, but medicines may be prescribed or recommended to relieve symptoms. Medicines may help:  Reduce your fever.  Reduce your cough.  Relieve nasal congestion. HOME CARE INSTRUCTIONS   Take medicines only as directed by your health care provider.   Gargle warm saltwater or take cough drops to comfort your throat as directed by your health care provider.  Use a warm mist humidifier or inhale steam from a shower to increase air moisture. This may make it easier to breathe.  Drink enough fluid to keep your urine clear or pale yellow.   Eat soups and other clear broths and maintain good nutrition.   Rest as needed.   Return to work when your temperature has returned to normal or as your health care provider advises. You may need to stay home longer to avoid infecting others. You can also use a face mask and careful hand washing to prevent spread of the virus.  Increase the usage of your inhaler if you have asthma.   Do not use any tobacco products, including cigarettes, chewing tobacco, or electronic cigarettes. If you need help quitting, ask your health care provider. PREVENTION  The best way to protect yourself from getting a cold is to practice good hygiene.   Avoid oral or hand contact with people with cold   symptoms.   Wash your hands often if contact occurs.  There is no clear evidence that vitamin C, vitamin E, echinacea, or exercise reduces the chance of developing a cold. However, it is always recommended to get plenty of rest, exercise, and practice good nutrition.  SEEK MEDICAL CARE IF:   You are getting worse rather than better.   Your symptoms are not controlled by medicine.   You have chills.  You have worsening shortness of breath.  You have brown or red mucus.  You have yellow or brown nasal  discharge.  You have pain in your face, especially when you bend forward.  You have a fever.  You have swollen neck glands.  You have pain while swallowing.  You have white areas in the back of your throat. SEEK IMMEDIATE MEDICAL CARE IF:   You have severe or persistent:  Headache.  Ear pain.  Sinus pain.  Chest pain.  You have chronic lung disease and any of the following:  Wheezing.  Prolonged cough.  Coughing up blood.  A change in your usual mucus.  You have a stiff neck.  You have changes in your:  Vision.  Hearing.  Thinking.  Mood. MAKE SURE YOU:   Understand these instructions.  Will watch your condition.  Will get help right away if you are not doing well or get worse.   This information is not intended to replace advice given to you by your health care provider. Make sure you discuss any questions you have with your health care provider.   Document Released: 04/12/2001 Document Revised: 03/03/2015 Document Reviewed: 01/22/2014 Elsevier Interactive Patient Education 2016 [employer]vier Inc[employer]  

## 2015-11-03 NOTE — ED Notes (Signed)
Pt c/o flu like symptoms since Friday.  Pt sts that OTC meds have not been helping  Pt presents w/ dry cough.  Pt NAD, ambulates freely.

## 2015-11-03 NOTE — ED Notes (Signed)
Pt complains of cough, congestion and sore throat since Friday, pt denies fever

## 2015-11-03 NOTE — ED Provider Notes (Signed)
[facility] Emergency Department Provider Note  ____________________________________________  Time seen: Approximately 11:13 AM  I have reviewed the triage vital signs and the nursing notes.   HISTORY  Chief Complaint URI    HPI Tina Kemp is a 40 y.o. female who presents with flulike symptoms 5 days. Patient reports that over-the-counter medications are not helping. Presents with complaints of dry cough congestion sore throat. Denies any fever chills.   History reviewed. No pertinent past medical history.  There are no active problems to display for this patient.   Past Surgical History  Procedure Laterality Date  . Abdominal hysterectomy      Current Outpatient Rx  Name  Route  Sig  Dispense  Refill  . acetaminophen (TYLENOL) 325 MG tablet   Oral   Take 650 mg by mouth every 6 (six) hours as needed.         Marland Kitchen. azithromycin (ZITHROMAX Z-PAK) 250 MG tablet      Take 2 tablets (500 mg) on  Day 1,  followed by 1 tablet (250 mg) once daily on Days 2 through 5.   6 each   0   . guaiFENesin-codeine 100-10 MG/5ML syrup   Oral   Take 10 mLs by mouth every 4 (four) hours as needed for cough.   180 mL   0   . ibuprofen (ADVIL,MOTRIN) 600 MG tablet   Oral   Take 600 mg by mouth every 6 (six) hours as needed.         Marland Kitchen. oxyCODONE (OXY IR/ROXICODONE) 5 MG immediate release tablet   Oral   Take 5 mg by mouth every 4 (four) hours as needed for severe pain.         . pseudoephedrine (SUDAFED) 60 MG tablet   Oral   Take 1 tablet (60 mg total) by mouth every 4 (four) hours as needed for congestion.   24 tablet   0     Allergies Tramadol  No family history on file.  Social History Social History  Substance Use Topics  . Smoking status: Current Every Day Smoker -- 0.50 packs/day    Types: Cigarettes  . Smokeless tobacco: None  . Alcohol Use: No    Review of Systems Constitutional: No fever/chills Eyes: No visual  changes. ENT: Positive for sore throat and head congestion. Cardiovascular: Denies chest pain. Respiratory: Denies shortness of breath. Positive for cough. Gastrointestinal: No abdominal pain.  No nausea, no vomiting.  No diarrhea.  No constipation. Genitourinary: Negative for dysuria. Musculoskeletal: Negative for back pain. Skin: Negative for rash. Neurological: Negative for headaches, focal weakness or numbness.  10-point ROS otherwise negative.  ____________________________________________   PHYSICAL EXAM:  VITAL SIGNS: ED Triage Vitals  Enc Vitals Group     BP 11/03/15 1053 105/67 mmHg     Pulse Rate 11/03/15 1053 80     Resp 11/03/15 1053 20     Temp 11/03/15 1053 97.9 F (36.6 C)     Temp Source 11/03/15 1053 Oral     SpO2 11/03/15 1053 99 %     Weight 11/03/15 1053 170 lb (77.111 kg)     Height 11/03/15 1053 5\' 2"  (1.575 m)     Head Cir --      Peak Flow --      Pain Score --      Pain Loc --      Pain Edu? --      Excl. in GC? --  Constitutional: Alert and oriented. Well appearing and in no acute distress. Eyes: Conjunctivae are normal. PERRL. EOMI. Head: Atraumatic. Nose: Positive congestion/rhinnorhea with swollen nasal turbinates. Mouth/Throat: Mucous membranes are moist.  Oropharynx non-erythematous. Neck: No stridor.   Cardiovascular: Normal rate, regular rhythm. Grossly normal heart sounds.  Good peripheral circulation. Respiratory: Normal respiratory effort.  No retractions. Lungs CTAB. Coarse breath sounds noted bilaterally Musculoskeletal: No lower extremity tenderness nor edema.  No joint effusions. Neurologic:  Normal speech and language. No gross focal neurologic deficits are appreciated. No gait instability. Skin:  Skin is warm, dry and intact. No rash noted. Psychiatric: Mood and affect are normal. Speech and behavior are normal.  ____________________________________________   LABS (all labs ordered are listed, but only abnormal results  are displayed)  Labs Reviewed - No data to display ____________________________________________  RADIOLOGY  No acute cardiopulmonary findings ____________________________________________   PROCEDURES  Procedure(s) performed: None  Critical Care performed: No  ____________________________________________   INITIAL IMPRESSION / ASSESSMENT AND PLAN / ED COURSE  Pertinent labs & imaging results that were available during my care of the patient were reviewed by me and considered in my medical decision making (see chart for details).  Respiratory infection. Rx given for Zithromax, Robitussin-AC and Sudafed 60 mg. Patient follow-up PCP or return to the ER with any worsening symptomology. ____________________________________________   FINAL CLINICAL IMPRESSION(S) / ED DIAGNOSES  Final diagnoses:  URI, acute      Evangeline Dakin, PA-C 11/03/15 1230  Sharman Cheek, MD 11/03/15 [phone]

## 2015-12-23 ENCOUNTER — Encounter: Payer: Self-pay | Admitting: *Deleted

## 2015-12-23 DIAGNOSIS — W010XXA Fall on same level from slipping, tripping and stumbling without subsequent striking against object, initial encounter: Secondary | ICD-10-CM | POA: Insufficient documentation

## 2015-12-23 DIAGNOSIS — Y9289 Other specified places as the place of occurrence of the external cause: Secondary | ICD-10-CM | POA: Insufficient documentation

## 2015-12-23 DIAGNOSIS — F17210 Nicotine dependence, cigarettes, uncomplicated: Secondary | ICD-10-CM | POA: Insufficient documentation

## 2015-12-23 DIAGNOSIS — S82831A Other fracture of upper and lower end of right fibula, initial encounter for closed fracture: Secondary | ICD-10-CM | POA: Insufficient documentation

## 2015-12-23 DIAGNOSIS — Y9301 Activity, walking, marching and hiking: Secondary | ICD-10-CM | POA: Insufficient documentation

## 2015-12-23 DIAGNOSIS — Y998 Other external cause status: Secondary | ICD-10-CM | POA: Insufficient documentation

## 2015-12-23 DIAGNOSIS — Z792 Long term (current) use of antibiotics: Secondary | ICD-10-CM | POA: Insufficient documentation

## 2015-12-23 NOTE — ED Notes (Signed)
Pt was walking the dog tonight and slipped on fell on wet grass.  Pt has swelling.  No other injury noted.

## 2015-12-24 ENCOUNTER — Emergency Department
Admission: EM | Admit: 2015-12-24 | Discharge: 2015-12-24 | Disposition: A | Discharge status: Home / Self Care | Payer: 59 | Attending: Emergency Medicine | Admitting: Emergency Medicine

## 2015-12-24 ENCOUNTER — Emergency Department: Payer: 59

## 2015-12-24 DIAGNOSIS — S82401A Unspecified fracture of shaft of right fibula, initial encounter for closed fracture: Secondary | ICD-10-CM

## 2015-12-24 MED ORDER — OXYCODONE-ACETAMINOPHEN 5-325 MG PO TABS: 1.0000 | ORAL_TABLET | Freq: Four times a day (QID) | ORAL | Status: DC | PRN

## 2015-12-24 MED ORDER — ONDANSETRON 4 MG PO TBDP: 4.0000 mg | ORAL_TABLET | Freq: Three times a day (TID) | ORAL | Status: DC | PRN

## 2015-12-24 MED ORDER — OXYCODONE-ACETAMINOPHEN 5-325 MG PO TABS
2.0000 | ORAL_TABLET | Freq: Once | ORAL | Status: AC
Start: 2015-12-24 — End: 2015-12-24
  Administered 2015-12-24: 2 via ORAL
  Filled 2015-12-24: qty 2

## 2015-12-24 MED ORDER — ONDANSETRON 4 MG PO TBDP
4.0000 mg | ORAL_TABLET | Freq: Once | ORAL | Status: AC
  Administered 2015-12-24: 4 mg via ORAL
  Filled 2015-12-24: qty 1

## 2015-12-24 NOTE — Discharge Instructions (Signed)
Fibular Ankle Fracture Treated With or Without Immobilization, Adult °A fibular fracture at your ankle is a break (fracture) bone in the smallest of the two bones in your lower leg, located on the outside of your leg (fibula) close to the area at your ankle joint. °CAUSES °· Rolling your ankle. °· Twisting your ankle. °· Extreme flexing or extending of your foot. °· Severe force on your ankle as when falling from a distance. °RISK FACTORS °· Jumping activities. °· Participation in sports. °· Osteoporosis. °· Advanced age. °· Previous ankle injuries. °SIGNS AND SYMPTOMS °· Pain. °· Swelling. °· Inability to put weight on injured ankle. °· Bruising. °· Bone deformities at site of injury. °DIAGNOSIS  °This fracture is diagnosed with the help of an X-ray exam. °TREATMENT  °If the fractured bone did not move out of place it usually will heal without problems and does casting or splinting. If immobilization is needed for comfort or the fractured bone moved out of place and will not heal properly with immobilization, a cast or splint will be used. °HOME CARE INSTRUCTIONS  °· Apply ice to the area of injury: °¨ Put ice in a plastic bag. °¨ Place a towel between your skin and the bag. °¨ Leave the ice on for 20 minutes, 2-3 times a day. °· Use crutches as directed. Resume walking without crutches as directed by your health care provider. °· Only take over-the-counter or prescription medicines for pain, discomfort, or fever as directed by your health care provider. °· If you have a removable splint or boot, do not remove the boot unless directed by your health care provider. °SEEK MEDICAL CARE IF:  °· You have continued pain or more swelling °· The medications do not control the pain. °SEEK IMMEDIATE MEDICAL CARE IF: °· You develop severe pain in the leg or foot. °· Your skin or nails below the injury turn blue or grey or feel cold or numb. °MAKE SURE YOU:  °· Understand these instructions. °· Will watch your  condition. °· Will get help right away if you are not doing well or get worse. °  °This information is not intended to replace advice given to you by your health care provider. Make sure you discuss any questions you have with your health care provider. °  °Document Released: 10/17/2005 Document Revised: 11/07/2014 Document Reviewed: 05/29/2013 °Elsevier Interactive Patient Education ©2016 [employer] Inc. ° °

## 2015-12-24 NOTE — ED Provider Notes (Addendum)
[facility] Emergency Department Provider Note  Time seen: 2:45 AM  I have reviewed the triage vital signs and the nursing notes.   HISTORY  Chief Complaint Ankle Pain    HPI Tina Kemp is a 40 y.o. female with no past medical history presents to the emergency department right ankle pain after a fall. According to the patient she was walking her dog when she slipped falling onto the ground. States her right ankle was rolled underneath of her when she fell. States significant pain to the right ankle, moderate swelling to the ankle. Has not been able to ambulate on the ankle due to pain.Denies any other injuries. Denies hitting her head or loss of consciousness.     No past medical history on file.  There are no active problems to display for this patient.   Past Surgical History  Procedure Laterality Date  . Abdominal hysterectomy      Current Outpatient Rx  Name  Route  Sig  Dispense  Refill  . acetaminophen (TYLENOL) 325 MG tablet   Oral   Take 650 mg by mouth every 6 (six) hours as needed.         Marland Kitchen azithromycin (ZITHROMAX Z-PAK) 250 MG tablet      Take 2 tablets (500 mg) on  Day 1,  followed by 1 tablet (250 mg) once daily on Days 2 through 5.   6 each   0   . guaiFENesin-codeine 100-10 MG/5ML syrup   Oral   Take 10 mLs by mouth every 4 (four) hours as needed for cough.   180 mL   0   . ibuprofen (ADVIL,MOTRIN) 600 MG tablet   Oral   Take 600 mg by mouth every 6 (six) hours as needed.         Marland Kitchen oxyCODONE (OXY IR/ROXICODONE) 5 MG immediate release tablet   Oral   Take 5 mg by mouth every 4 (four) hours as needed for severe pain.         . pseudoephedrine (SUDAFED) 60 MG tablet   Oral   Take 1 tablet (60 mg total) by mouth every 4 (four) hours as needed for congestion.   24 tablet   0     Allergies Tramadol  No family history on file.  Social History Social History  Substance Use Topics  . Smoking status:  Current Every Day Smoker -- 0.50 packs/day    Types: Cigarettes  . Smokeless tobacco: None  . Alcohol Use: No    Review of Systems Constitutional: Negative for fever. Cardiovascular: Negative for chest pain. Respiratory: Negative for shortness of breath. Gastrointestinal: Negative for abdominal pain Musculoskeletal: Negative for back pain. Negative for neck pain. Positive for right ankle pain. Neurological: Negative for headache 10-point ROS otherwise negative.  ____________________________________________   PHYSICAL EXAM:  VITAL SIGNS: ED Triage Vitals  Enc Vitals Group     BP 12/23/15 2350 122/81 mmHg     Pulse Rate 12/23/15 2350 98     Resp 12/23/15 2350 18     Temp 12/23/15 2350 98.8 F (37.1 C)     Temp Source 12/23/15 2350 Oral     SpO2 12/23/15 2350 98 %     Weight 12/23/15 2350 160 lb (72.576 kg)     Height 12/23/15 2350  (1.575 m)     Head Cir --      Peak Flow --      Pain Score 12/23/15 2358 10  Pain Loc --      Pain Edu? --      Excl. in GC? --     Constitutional: Alert and oriented. Well appearing and in no distress. Eyes: Normal exam ENT   Head: Normocephalic and atraumatic.   Mouth/Throat: Mucous membranes are moist. Cardiovascular: Normal rate, regular rhythm. No murmur Respiratory: Normal respiratory effort without tachypnea nor retractions. Breath sounds are clear Gastrointestinal: Soft and nontender. No distention. Musculoskeletal: Moderate tenderness to palpation over the distal fibula/lateral malleolus of the right ankle. 2+ DP pulse, sensation intact. Moderate swelling of the right ankle. Neurologic:  Normal speech and language. No gross focal neurologic deficits Skin:  Skin is warm, dry and intact.  Psychiatric: Mood and affect are normal. Speech and behavior are normal.   ____________________________________________   RADIOLOGY  X-ray consistent with distal fibula fracture.   INITIAL IMPRESSION / ASSESSMENT AND PLAN /  ED COURSE  Pertinent labs & imaging results that were available during my care of the patient were reviewed by me and considered in my medical decision making (see chart for details).  X-ray an exam consistent with distal fibula fracture. We'll place the patient in a short leg splint and have her follow-up with Dr. Hyacinth Meeker. Patient fractured her distal fibula and a similar location last year after a fall in to a pothole. Patient saw Dr. Hyacinth Meeker at that time as well. We will discharge with Percocet as needed for pain control. The patient has crutches in her car.  Status post splint, right foot remains neurovascularly intact. Well placed/applied splint. We'll discharge the patient home with orthopedic follow-up.  ____________________________________________   FINAL CLINICAL IMPRESSION(S) / ED DIAGNOSES  Right distal fibula fracture   Minna Antis, MD 12/24/15 1610  Minna Antis, MD 12/24/15 [phone]

## 2016-07-23 LAB — HM PAP SMEAR

## 2016-09-09 ENCOUNTER — Encounter: Payer: Self-pay | Admitting: Emergency Medicine

## 2016-09-09 ENCOUNTER — Emergency Department
Admission: EM | Admit: 2016-09-09 | Discharge: 2016-09-09 | Disposition: A | Discharge status: Home / Self Care | Payer: 59 | Attending: Emergency Medicine | Admitting: Emergency Medicine

## 2016-09-09 DIAGNOSIS — R42 Dizziness and giddiness: Secondary | ICD-10-CM

## 2016-09-09 DIAGNOSIS — J069 Acute upper respiratory infection, unspecified: Secondary | ICD-10-CM

## 2016-09-09 DIAGNOSIS — F17210 Nicotine dependence, cigarettes, uncomplicated: Secondary | ICD-10-CM | POA: Insufficient documentation

## 2016-09-09 DIAGNOSIS — Z79899 Other long term (current) drug therapy: Secondary | ICD-10-CM | POA: Insufficient documentation

## 2016-09-09 DIAGNOSIS — M549 Dorsalgia, unspecified: Secondary | ICD-10-CM | POA: Insufficient documentation

## 2016-09-09 MED ORDER — MECLIZINE HCL 25 MG PO TABS: 25.0000 mg | ORAL_TABLET | Freq: Three times a day (TID) | ORAL | 0 refills | Status: DC | PRN

## 2016-09-09 MED ORDER — MECLIZINE HCL 25 MG PO TABS
25.0000 mg | ORAL_TABLET | Freq: Once | ORAL | Status: AC
  Administered 2016-09-09: 25 mg via ORAL
  Filled 2016-09-09: qty 1

## 2016-09-09 NOTE — ED Triage Notes (Signed)
Pt to ed with c/o intermittent dizziness, sore throat and back pain since Monday.

## 2016-09-09 NOTE — ED Provider Notes (Signed)
[facility] Emergency Department Provider Note  ____________________________________________   First MD Initiated Contact with Patient 09/09/16 1721     (approximate)  I have reviewed the triage vital signs and the nursing notes.   HISTORY  Chief Complaint Dizziness   HPI Tina RocaCrystal R Richardson DoppCole is a 40 y.o. female without any chronic medical conditions was presenting to the emergency department today with 3 days of sore throat, nasal congestion and then 2 days of vertigo. She says that she feels dizzy when she walks like she wants to lean to the left side. She says that the spinning sensation also as bad when she moves her head from side to side. Denies any ear pain or pressure. Denies any ringing in her ears. Denies any weakness or numbness. Says that she also has diffuse back pain like someone "punched her." Denies any burning with urination. Denies any fever. 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  History reviewed. No pertinent past medical history.  There are no active problems to display for this patient.   Past Surgical History:  Procedure Laterality Date  . ABDOMINAL HYSTERECTOMY      Prior to Admission medications   Medication Sig Start Date End Date Taking? Authorizing Provider  acetaminophen (TYLENOL) 325 MG tablet Take 650 mg by mouth every 6 (six) hours as needed.    Historical Provider, MD  azithromycin (ZITHROMAX Z-PAK) 250 MG tablet Take 2 tablets (500 mg) on  Day 1,  followed by 1 tablet (250 mg) once daily on Days 2 through 5. 11/03/15   Evangeline Dakinharles M Beers, PA-C  guaiFENesin-codeine 100-10 MG/5ML syrup Take 10 mLs by mouth every 4 (four) hours as needed for cough. 11/03/15   Charmayne Sheerharles M Beers, PA-C  ibuprofen (ADVIL,MOTRIN) 600 MG tablet Take 600 mg by mouth every 6 (six) hours as needed.    Historical Provider, MD  meclizine (ANTIVERT) 25 MG tablet Take 1 tablet (25 mg  total) by mouth 3 (three) times daily as needed for dizziness. 09/09/16   Myrna Blazeravid Matthew Schaevitz, MD  ondansetron (ZOFRAN ODT) 4 MG disintegrating tablet Take 1 tablet (4 mg total) by mouth every 8 (eight) hours as needed for nausea or vomiting. 12/24/15   Minna AntisKevin Paduchowski, MD  oxyCODONE (OXY IR/ROXICODONE) 5 MG immediate release tablet Take 5 mg by mouth every 4 (four) hours as needed for severe pain.    Historical Provider, MD  oxyCODONE-acetaminophen (ROXICET) 5-325 MG tablet Take 1 tablet by mouth every 6 (six) hours as needed. 12/24/15   Minna AntisKevin Paduchowski, MD  pseudoephedrine (SUDAFED) 60 MG tablet Take 1 tablet (60 mg total) by mouth every 4 (four) hours as needed for congestion. 11/03/15   Evangeline Dakinharles M Beers, PA-C    Allergies Tramadol  History reviewed. No pertinent family history.  Social History Social History  Substance Use Topics  . Smoking status: Current Every Day Smoker    Packs/day: 0.50    Types: Cigarettes  . Smokeless tobacco: Never Used  . Alcohol use No    Review of Systems Constitutional: No fever/chills Eyes: No visual changes. ENT: Mild to moderate throat pain. Cardiovascular: Denies chest pain. Respiratory: Denies shortness of breath. Gastrointestinal: No abdominal pain.  No nausea, no vomiting.  No diarrhea.  No constipation. Genitourinary: Negative for dysuria. Musculoskeletal: Diffuse back pain.. Skin: Negative for rash. Neurological: Negative for headaches, focal weakness or numbness.  10-point ROS otherwise negative.  ____________________________________________   PHYSICAL EXAM:  VITAL SIGNS: ED Triage Vitals  Enc Vitals Group     BP 09/09/16 1715 111/72     Pulse Rate 09/09/16 1715 79     Resp 09/09/16 1715 18     Temp 09/09/16 1715 98.6 F (37 C)     Temp Source 09/09/16 1715 Oral     SpO2 09/09/16 1715 98 %     Weight 09/09/16 1716 160 lb (72.6 kg)     Height 09/09/16 1716 5\' 2"  (1.575 m)     Head Circumference --      Peak Flow --       Pain Score 09/09/16 1717 0     Pain Loc --      Pain Edu? --      Excl. in GC? --     Constitutional: Alert and oriented. Well appearing and in no acute distress. Eyes: Conjunctivae are normal. PERRL. EOMI.No nystagmus. Head: Atraumatic. Right TM with mild bulging but still with good light reflex and no erythema. Normal left TM. Nose: No congestion/rhinnorhea. Mouth/Throat: Mucous membranes are moist.  Oropharynx non-erythematous. No enlarged or tender anterior cervical lymphadenopathy. Neck: No stridor.   Cardiovascular: Normal rate, regular rhythm. Grossly normal heart sounds.   Respiratory: Normal respiratory effort.  No retractions. Lungs CTAB. Gastrointestinal: Soft and nontender. No distention. No CVA tenderness. Musculoskeletal: No lower extremity tenderness nor edema.  No joint effusions. No tenderness to the back. No deformity or step-off to the midline thoracic or lumbar spines. Neurologic:  Normal speech and language. No gross focal neurologic deficits are appreciated. No gait instability.  Walks unassisted with a normal gait. No ataxia on finger to nose testing. Skin:  Skin is warm, dry and intact. No rash noted. Psychiatric: Mood and affect are normal. Speech and behavior are normal.  ____________________________________________   LABS (all labs ordered are listed, but only abnormal results are displayed)  Labs Reviewed - No data to display ____________________________________________  EKG   ____________________________________________  RADIOLOGY   ____________________________________________   PROCEDURES  Procedure(s) performed:   Procedures  Critical Care performed:   ____________________________________________   INITIAL IMPRESSION / ASSESSMENT AND PLAN / ED COURSE  Pertinent labs & imaging results that were available during my care of the patient were reviewed by me and considered in my medical decision making (see chart for details).  Likely  with peripheral vertigo secondary to URI. Explained the likely diagnosis to the patient. She'll be given meclizine for symptomatic relief. Patient is also requesting a work note which I gave her.  Clinical Course      ____________________________________________   FINAL CLINICAL IMPRESSION(S) / ED DIAGNOSES  Final diagnoses:  Upper respiratory tract infection, unspecified type  Vertigo      NEW MEDICATIONS STARTED DURING THIS VISIT:  Discharge Medication List as of 09/09/2016  5:43 PM    START taking these medications   Details  meclizine (ANTIVERT) 25 MG tablet Take 1 tablet (25 mg total) by mouth 3 (three) times daily as needed for dizziness., Starting Fri 09/09/2016, Print         Note:  This document was prepared using Dragon voice recognition software and may include unintentional dictation errors.    Myrna Blazeravid Matthew Schaevitz, MD 09/09/16 [phone]

## 2016-10-04 ENCOUNTER — Encounter: Payer: Self-pay | Admitting: Intensive Care

## 2016-10-04 ENCOUNTER — Emergency Department
Admission: EM | Admit: 2016-10-04 | Discharge: 2016-10-04 | Disposition: A | Discharge status: Home / Self Care | Payer: 59 | Attending: Emergency Medicine | Admitting: Emergency Medicine

## 2016-10-04 DIAGNOSIS — J019 Acute sinusitis, unspecified: Secondary | ICD-10-CM | POA: Insufficient documentation

## 2016-10-04 DIAGNOSIS — F17210 Nicotine dependence, cigarettes, uncomplicated: Secondary | ICD-10-CM | POA: Insufficient documentation

## 2016-10-04 DIAGNOSIS — Z79899 Other long term (current) drug therapy: Secondary | ICD-10-CM | POA: Insufficient documentation

## 2016-10-04 DIAGNOSIS — B9689 Other specified bacterial agents as the cause of diseases classified elsewhere: Secondary | ICD-10-CM

## 2016-10-04 MED ORDER — PSEUDOEPH-BROMPHEN-DM 30-2-10 MG/5ML PO SYRP: 10.0000 mL | ORAL_SOLUTION | Freq: Four times a day (QID) | ORAL | 0 refills | Status: DC | PRN

## 2016-10-04 MED ORDER — FLUTICASONE PROPIONATE 50 MCG/ACT NA SUSP: 1.0000 | Freq: Two times a day (BID) | NASAL | 0 refills | Status: DC

## 2016-10-04 MED ORDER — CETIRIZINE HCL 10 MG PO TABS: 10.0000 mg | ORAL_TABLET | Freq: Every day | ORAL | 0 refills | Status: DC

## 2016-10-04 MED ORDER — AMOXICILLIN-POT CLAVULANATE 875-125 MG PO TABS: 1.0000 | ORAL_TABLET | Freq: Two times a day (BID) | ORAL | 0 refills | Status: DC

## 2016-10-04 NOTE — ED Triage Notes (Signed)
PAtient presents to ER with c/o nasal drainage and cough that started Sunday with a fever that started yesterday. Patient ambulatory back to ER room. PAtient has been taking Tylenol at home for fever. Patient c/o headache. A&O x4

## 2016-10-04 NOTE — ED Provider Notes (Signed)
[facility] Emergency Department Provider Note  ____________________________________________  Time seen: Approximately 5:41 PM  I have reviewed the triage vital signs and the nursing notes.   HISTORY  Chief Complaint Fever    HPI Tina Kemp is a 40 y.o. female who presents emergency department complaining of his congestion, sinus pressure, headache, fevers, coughing. Patient states that symptoms began insidiously with nasal congestion and cough. Patient reports that the symptoms increased and she has built up quite a bit of sinus pressure. Yesterday, patient states that she was not feeling "good" and checked her temperature. She states that she was running a temperature. He has gotten up to 102F. He responds well to Tylenol. Patient reports frontal headache with this complaint. She denies any vision changes, neck pain, chest pain, shortness of breath,.pain, nausea vomiting. Other than Tylenol, patient does not take any medications for this complaint.   History reviewed. No pertinent past medical history.  There are no active problems to display for this patient.   Past Surgical History:  Procedure Laterality Date  . ABDOMINAL HYSTERECTOMY    . BREAST SURGERY Left    Left cysts removal  . LITHOTRIPSY      Prior to Admission medications   Medication Sig Start Date End Date Taking? Authorizing Provider  acetaminophen (TYLENOL) 325 MG tablet Take 650 mg by mouth every 6 (six) hours as needed.    Historical Provider, MD  amoxicillin-clavulanate (AUGMENTIN) 875-125 MG tablet Take 1 tablet by mouth 2 (two) times daily. 10/04/16   Delorise RoyalsJonathan D Teryn Gust, PA-C  brompheniramine-pseudoephedrine-DM 30-2-10 MG/5ML syrup Take 10 mLs by mouth 4 (four) times daily as needed. 10/04/16   Delorise RoyalsJonathan D Robie Mcniel, PA-C  cetirizine (ZYRTEC) 10 MG tablet Take 1 tablet (10 mg total) by mouth daily. 10/04/16   Delorise RoyalsJonathan D Norie Latendresse, PA-C  fluticasone (FLONASE) 50 MCG/ACT nasal spray  Place 1 spray into both nostrils 2 (two) times daily. 10/04/16   Delorise RoyalsJonathan D Sherill Mangen, PA-C  meclizine (ANTIVERT) 25 MG tablet Take 1 tablet (25 mg total) by mouth 3 (three) times daily as needed for dizziness. 09/09/16   Myrna Blazeravid Matthew Schaevitz, MD  ondansetron (ZOFRAN ODT) 4 MG disintegrating tablet Take 1 tablet (4 mg total) by mouth every 8 (eight) hours as needed for nausea or vomiting. 12/24/15   Minna AntisKevin Paduchowski, MD    Allergies Tramadol and Zofran Frazier Richards[ondansetron hcl]  History reviewed. No pertinent family history.  Social History Social History  Substance Use Topics  . Smoking status: Current Every Day Smoker    Packs/day: 0.50    Types: Cigarettes  . Smokeless tobacco: Never Used  . Alcohol use No     Review of Systems  Constitutional: No fever/chills Eyes: No visual changes. No discharge ENT: Positive for nasal congestion and sinus pressure Cardiovascular: no chest pain. Respiratory: Positive cough. No SOB. Gastrointestinal: No abdominal pain.  No nausea, no vomiting.   Musculoskeletal: Negative for musculoskeletal pain. Skin: Negative for rash, abrasions, lacerations, ecchymosis. Neurological: Positive for headache but denies focal weakness or numbness. 10-point ROS otherwise negative.  ____________________________________________   PHYSICAL EXAM:  VITAL SIGNS: ED Triage Vitals  Enc Vitals Group     BP 10/04/16 1732 111/73     Pulse Rate 10/04/16 1732 90     Resp 10/04/16 1732 18     Temp 10/04/16 1732 98.7 F (37.1 C)     Temp Source 10/04/16 1732 Oral     SpO2 10/04/16 1732 96 %     Weight 10/04/16 1733  160 lb (72.6 kg)     Height 10/04/16 1733 5\' 2"  (1.575 m)     Head Circumference --      Peak Flow --      Pain Score 10/04/16 1739 9     Pain Loc --      Pain Edu? --      Excl. in GC? --      Constitutional: Alert and oriented. Well appearing and in no acute distress. Eyes: Conjunctivae are normal. PERRL. EOMI. Head: Atraumatic. ENT:       Ears: EACs and TMs are unremarkable bilaterally.      Nose: Moderate congestion/rhinnorhea. Patient is tender to percussion over the maxillary sinuses.      Mouth/Throat: Mucous membranes are moist. Oropharynx is erythematous but nonedematous. Uvula is midline. Neck: No stridor.   Hematological/Lymphatic/Immunilogical: No cervical lymphadenopathy. Cardiovascular: Normal rate, regular rhythm. Normal S1 and S2.  Good peripheral circulation. Respiratory: Normal respiratory effort without tachypnea or retractions. Lungs CTAB. Good air entry to the bases with no decreased or absent breath sounds. Musculoskeletal: Full range of motion to all extremities. No gross deformities appreciated. Neurologic:  Normal speech and language. No gross focal neurologic deficits are appreciated.  Skin:  Skin is warm, dry and intact. No rash noted. Psychiatric: Mood and affect are normal. Speech and behavior are normal. Patient exhibits appropriate insight and judgement.   ____________________________________________   LABS (all labs ordered are listed, but only abnormal results are displayed)  Labs Reviewed - No data to display ____________________________________________  EKG   ____________________________________________  RADIOLOGY   No results found.  ____________________________________________    PROCEDURES  Procedure(s) performed:    Procedures    Medications - No data to display   ____________________________________________   INITIAL IMPRESSION / ASSESSMENT AND PLAN / ED COURSE  Pertinent labs & imaging results that were available during my care of the patient were reviewed by me and considered in my medical decision making (see chart for details).  Review of the [place] CSRS was performed in accordance of the [facility] prior to dispensing any controlled drugs.  Clinical Course     Patient's diagnosis is consistent with Acute bacterial sinusitis.. Patient will be discharged home  with prescriptions for antibiotics, Flonase, Zyrtec, and cough syrup. Patient is to follow up with Primary care as needed or otherwise directed. Patient is given ED precautions to return to the ED for any worsening or new symptoms.     ____________________________________________  FINAL CLINICAL IMPRESSION(S) / ED DIAGNOSES  Final diagnoses:  Acute bacterial sinusitis      NEW MEDICATIONS STARTED DURING THIS VISIT:  Discharge Medication List as of 10/04/2016  5:43 PM    START taking these medications   Details  amoxicillin-clavulanate (AUGMENTIN) 875-125 MG tablet Take 1 tablet by mouth 2 (two) times daily., Starting Tue 10/04/2016, Print    brompheniramine-pseudoephedrine-DM 30-2-10 MG/5ML syrup Take 10 mLs by mouth 4 (four) times daily as needed., Starting Tue 10/04/2016, Print    cetirizine (ZYRTEC) 10 MG tablet Take 1 tablet (10 mg total) by mouth daily., Starting Tue 10/04/2016, Print    fluticasone (FLONASE) 50 MCG/ACT nasal spray Place 1 spray into both nostrils 2 (two) times daily., Starting Tue 10/04/2016, Print            This chart was dictated using voice recognition software/Dragon. Despite best efforts to proofread, errors can occur which can change the meaning. Any change was purely unintentional.    Racheal Patches, PA-C 10/04/16 1836  Governor Rooksebecca Lord, MD 10/06/16 [phone]

## 2016-10-13 LAB — BASIC METABOLIC PANEL
BUN: 12 (ref 4–21)
Creatinine: 0.7 (ref 0.5–1.1)
Glucose: 76
Potassium: 4 (ref 3.4–5.3)
Sodium: 142 (ref 137–147)

## 2016-10-13 LAB — HEPATIC FUNCTION PANEL
ALT: 18 (ref 7–35)
AST: 16 (ref 13–35)
Alkaline Phosphatase: 61 (ref 25–125)
Bilirubin, Total: 0.2

## 2016-10-13 LAB — CBC AND DIFFERENTIAL
HCT: 41 (ref 36–46)
HEMOGLOBIN: 14.2 (ref 12.0–16.0)
Platelets: 355 (ref 150–399)
WBC: 9.2

## 2016-10-13 LAB — TSH: TSH: 0.87 (ref 0.41–5.90)

## 2016-11-19 IMAGING — CR DG CHEST 2V
1 series · 2 of 2 positions shown · non-contrast
Comparison: 05/08/2015

CLINICAL DATA: Flu-like symptoms since [REDACTED].  Cough.

EXAM:
CHEST  2 VIEW

[Series 1: w chest pa · 0.14mm/px · 2 of 2 slices shown]
[im 1/2]
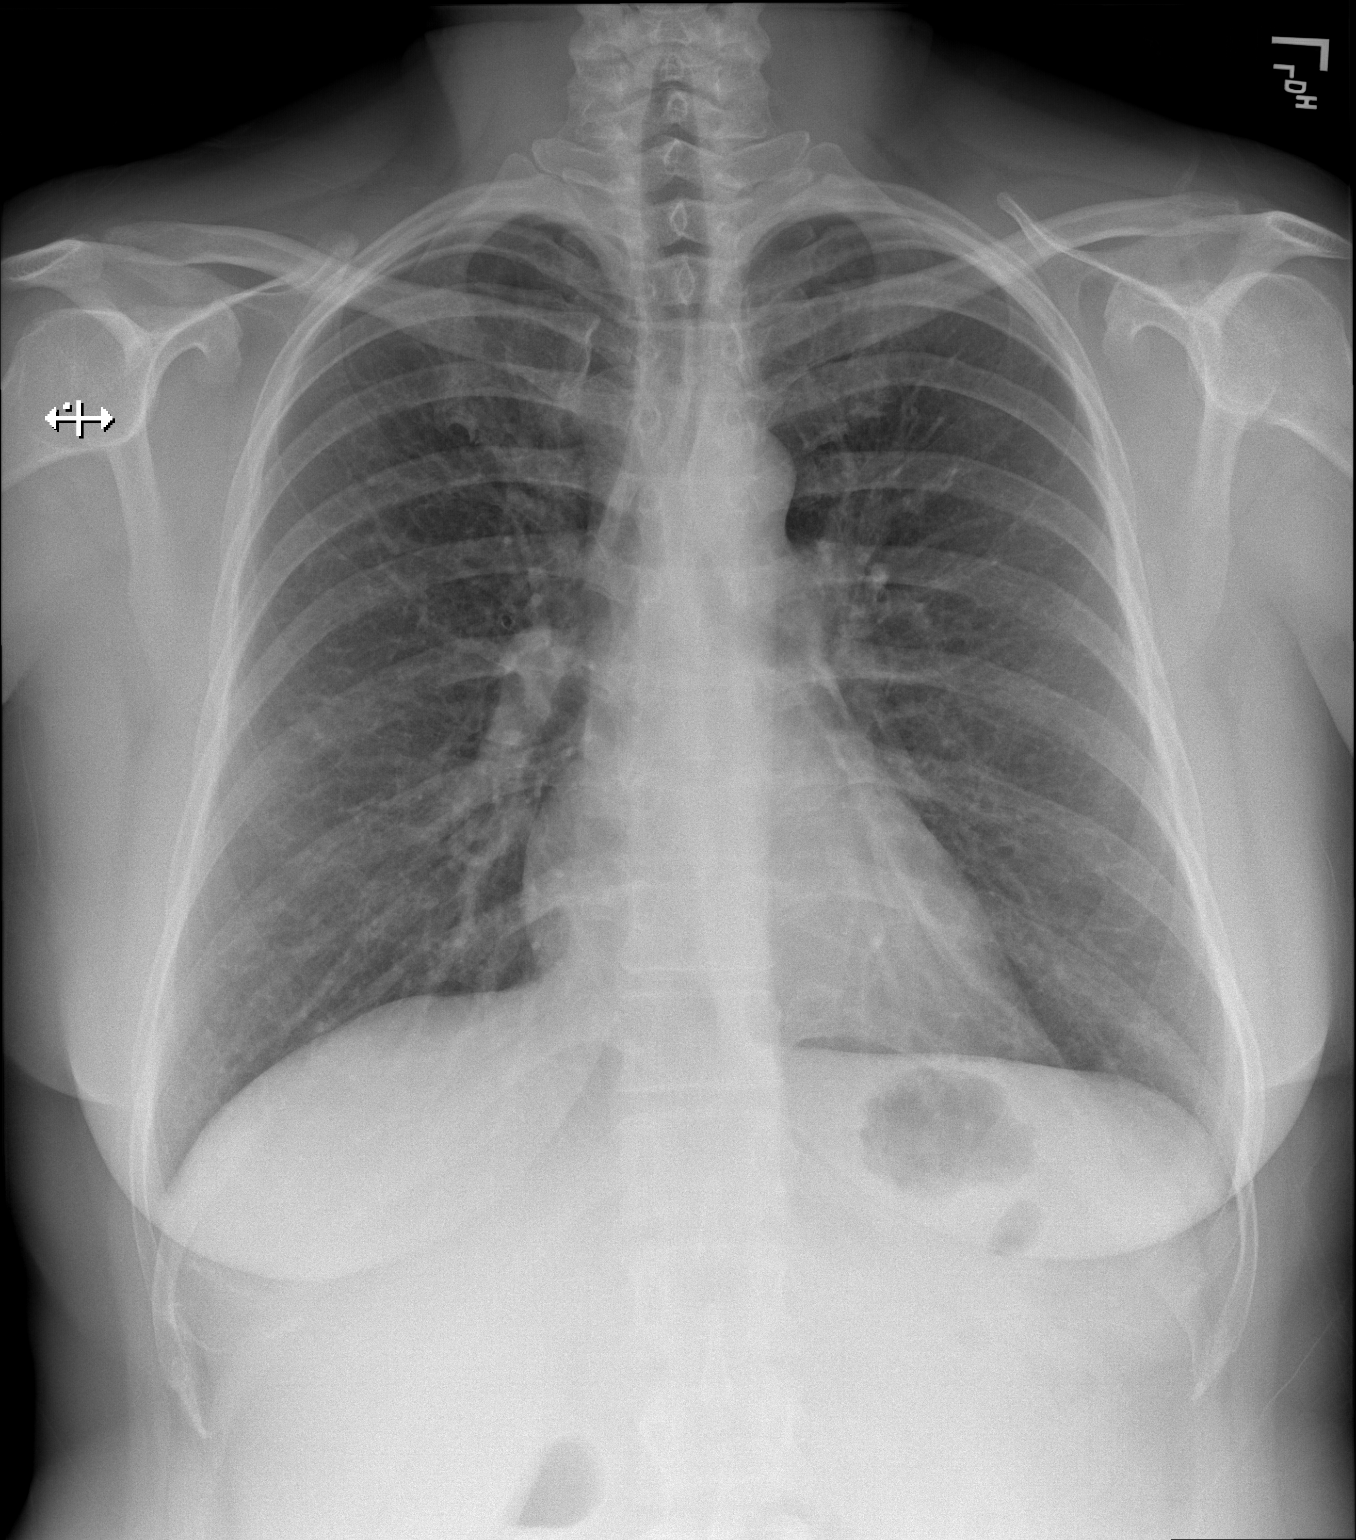
[im 2/2]
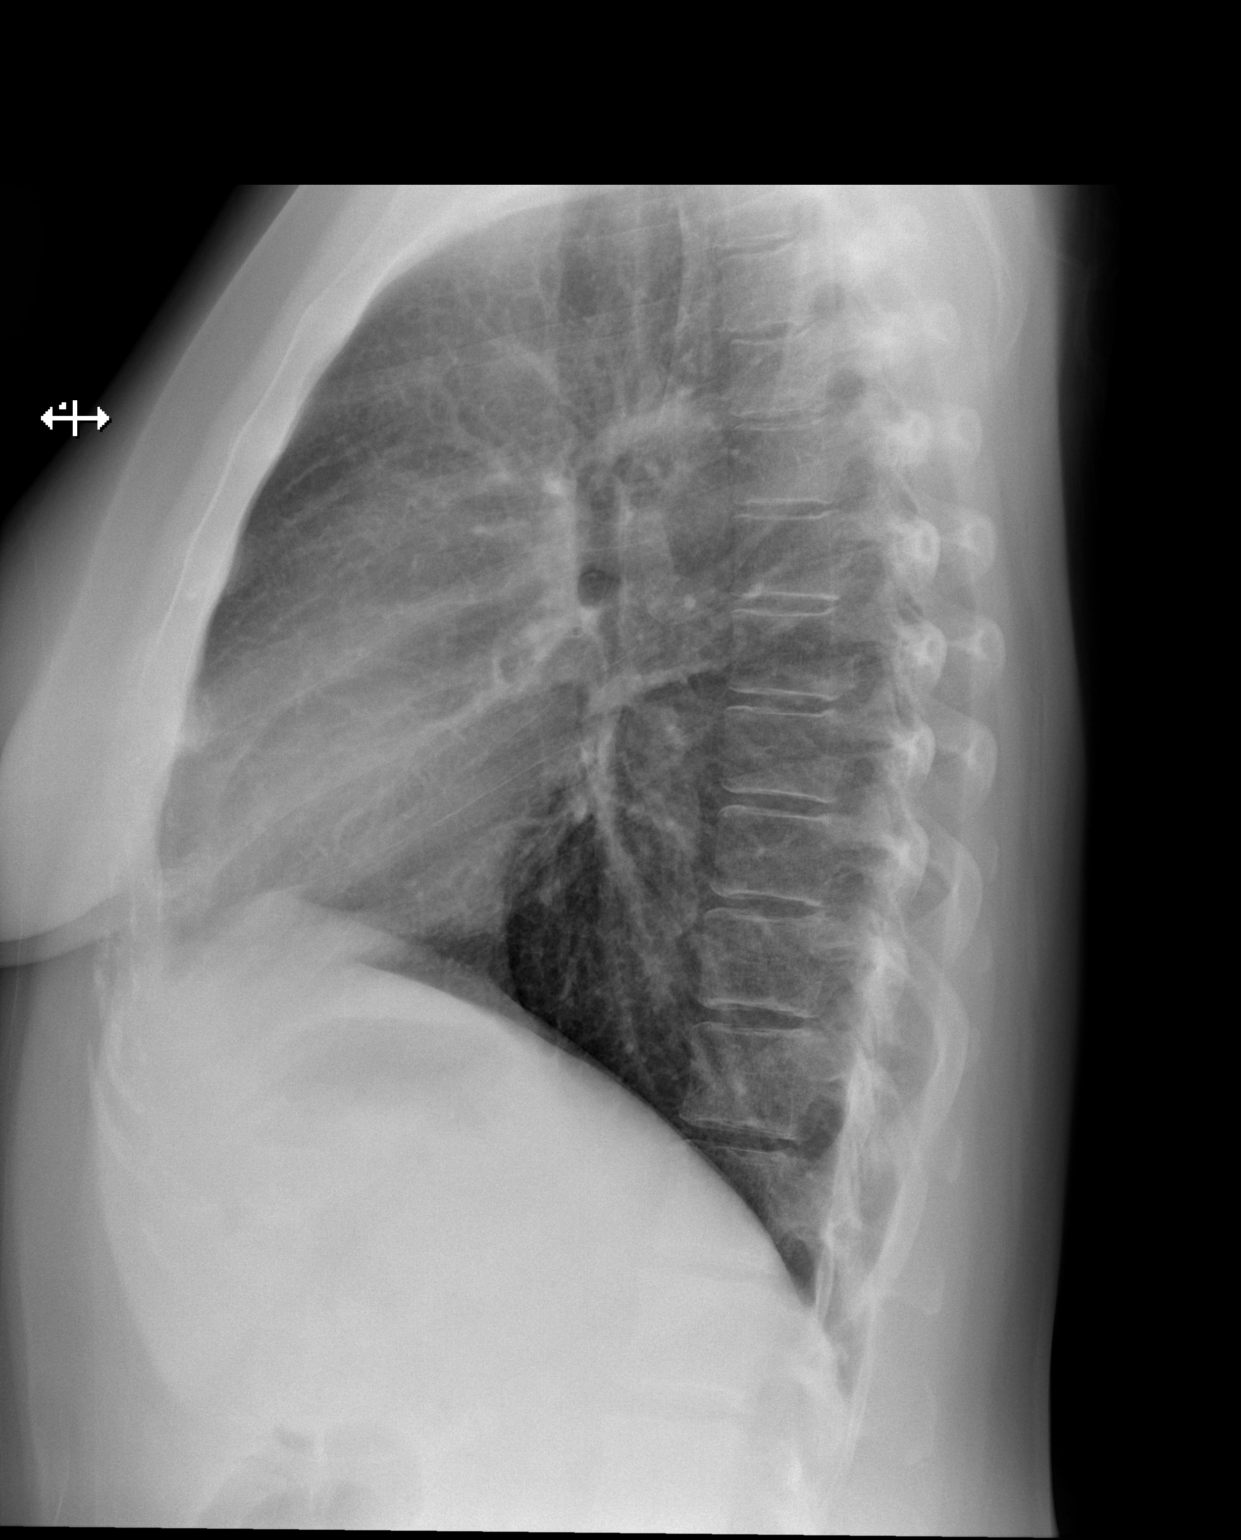

[2 of 2 positions shown; findings below may reference images not displayed]

FINDINGS: The heart size and mediastinal contours are within normal limits.
Both lungs are clear. The visualized skeletal structures are
unremarkable.
IMPRESSION: No active cardiopulmonary disease.

## 2017-12-18 ENCOUNTER — Encounter: Payer: Self-pay | Admitting: Family

## 2017-12-18 ENCOUNTER — Ambulatory Visit: Payer: Self-pay | Admitting: Family

## 2017-12-18 VITALS — BP 115/70 | HR 88 | Temp 98.5°F | Resp 16 | Wt 170.6 lb

## 2017-12-18 DIAGNOSIS — M5442 Lumbago with sciatica, left side: Secondary | ICD-10-CM

## 2017-12-18 DIAGNOSIS — M5441 Lumbago with sciatica, right side: Secondary | ICD-10-CM

## 2017-12-18 MED ORDER — CYCLOBENZAPRINE HCL 5 MG PO TABS: 5.0000 mg | ORAL_TABLET | Freq: Three times a day (TID) | ORAL | 1 refills | Status: DC | PRN

## 2017-12-18 MED ORDER — ETODOLAC 300 MG PO CAPS: 300.0000 mg | ORAL_CAPSULE | Freq: Two times a day (BID) | ORAL | 1 refills | Status: DC

## 2017-12-18 MED ORDER — PREDNISONE 10 MG (21) PO TBPK: ORAL_TABLET | ORAL | 0 refills | Status: DC

## 2017-12-18 MED FILL — CYCLOBENZAPRINE 5 MG TABLET: 5 | 20 days supply | Qty: 60 | Fill #0

## 2017-12-18 MED FILL — predniSONE 10 MG TABS: 10 | 6 days supply | Qty: 21 | Fill #0

## 2017-12-18 MED FILL — ETODOLAC 300 MG CAPSULE: 300 | 30 days supply | Qty: 60 | Fill #0

## 2017-12-18 NOTE — Progress Notes (Addendum)
   Subjective:    Patient ID: Tina Kemp, female    DOB: 13-Oct-1976, 42 y.o.   MRN: [id number]  Back Pain  This is a recurrent problem. The current episode started 1 to 4 weeks ago. The problem occurs constantly. The problem has been gradually worsening since onset. The pain is present in the lumbar spine. The quality of the pain is described as aching. The pain radiates to the left thigh and right thigh. The pain is at a severity of 8/10. The pain is moderate. The symptoms are aggravated by bending, twisting and standing. Associated symptoms include leg pain and numbness. Pertinent negatives include no bladder incontinence, bowel incontinence or dysuria. She has tried bed rest and NSAIDs for the symptoms. The treatment provided mild relief.      Review of Systems  Gastrointestinal: Negative for bowel incontinence.  Genitourinary: Negative for bladder incontinence and dysuria.  Musculoskeletal: Positive for back pain.  Neurological: Positive for numbness.  All other systems reviewed and are negative.      Objective:   Physical Exam  Constitutional: She is oriented to person, place, and time. She appears well-developed and well-nourished. No distress.  HENT:  Head: Normocephalic.  Eyes: Pupils are equal, round, and reactive to light.  Neck: Normal range of motion. Neck supple. No thyromegaly present.  Cardiovascular: Normal rate, regular rhythm, normal heart sounds and intact distal pulses.  No murmur heard. Pulmonary/Chest: Effort normal and breath sounds normal. No respiratory distress. She has no wheezes.  Abdominal: Soft. Bowel sounds are normal. She exhibits no distension. There is no tenderness.  Musculoskeletal: She exhibits tenderness. She exhibits no edema.  Pain in lower lumbar with flexion and extension, negative SLR   Neurological: She is alert and oriented to person, place, and time.  Skin: Skin is warm and dry.  Psychiatric: She has a normal mood and affect. Her  behavior is normal. Judgment and thought content normal.  Vitals reviewed.     BP 115/70 (BP Location: Left Arm, Patient Position: Sitting, Cuff Size: Normal)   Pulse 88   Temp 98.5 F (36.9 C) (Oral)   Resp 16   Wt 170 lb 9.6 oz (77.4 kg)   SpO2 96%   BMI 31.20 kg/m      Assessment & Plan:  1. Acute bilateral low back pain with bilateral sciatica Rest Ice and heat as needed ROM exercises RTO prn or if symptoms do not improve or worsen - predniSONE (STERAPRED UNI-PAK 21 TAB) 10 MG (21) TBPK tablet; Use as directed  Dispense: 21 tablet; Refill: 0 - cyclobenzaprine (FLEXERIL) 5 MG tablet; Take 1 tablet (5 mg total) by mouth 3 (three) times daily as needed for muscle spasms.  Dispense: 60 tablet; Refill: 1 - etodolac (LODINE) 300 MG capsule; Take 1 capsule (300 mg total) by mouth 2 (two) times daily.  Dispense: 60 capsule; Refill: 1    Jannifer Rodneyhristy Kassiah Mccrory, FNP

## 2017-12-18 NOTE — Patient Instructions (Signed)

## 2017-12-20 ENCOUNTER — Telehealth: Payer: Self-pay

## 2017-12-20 NOTE — Telephone Encounter (Signed)
I left a message asking the patient to return the call.

## 2018-02-12 MED FILL — CYCLOBENZAPRINE 5 MG TABLET: 5 | 20 days supply | Qty: 60 | Fill #1

## 2018-05-01 ENCOUNTER — Ambulatory Visit: Payer: 59 | Admitting: Family Medicine

## 2018-05-22 ENCOUNTER — Encounter: Payer: Self-pay | Admitting: Family Medicine

## 2018-05-22 ENCOUNTER — Ambulatory Visit (INDEPENDENT_AMBULATORY_CARE_PROVIDER_SITE_OTHER): Payer: 59 | Admitting: Family Medicine

## 2018-05-22 VITALS — BP 118/79 | HR 85 | Wt 171.5 lb

## 2018-05-22 DIAGNOSIS — Z833 Family history of diabetes mellitus: Secondary | ICD-10-CM

## 2018-05-22 DIAGNOSIS — E669 Obesity, unspecified: Secondary | ICD-10-CM

## 2018-05-22 DIAGNOSIS — Z801 Family history of malignant neoplasm of trachea, bronchus and lung: Secondary | ICD-10-CM | POA: Insufficient documentation

## 2018-05-22 DIAGNOSIS — Z720 Tobacco use: Secondary | ICD-10-CM

## 2018-05-22 DIAGNOSIS — N6001 Solitary cyst of right breast: Secondary | ICD-10-CM | POA: Diagnosis not present

## 2018-05-22 DIAGNOSIS — Z716 Tobacco abuse counseling: Secondary | ICD-10-CM | POA: Diagnosis not present

## 2018-05-22 DIAGNOSIS — Z90711 Acquired absence of uterus with remaining cervical stump: Secondary | ICD-10-CM | POA: Diagnosis not present

## 2018-05-22 NOTE — Progress Notes (Signed)
New patient office visit note:  Impression and Recommendations:    1. Family history of diabetes mellitus in father   2. Family history of lung cancer   3. History of partial hysterectomy   4. Breast cysts, right   5. Obesity, Class I, BMI 30-34.9   6. Tobacco abuse   7. Tobacco abuse counseling     1. Screenings - Encouraged patient to obtain regular health screenings and make sure her mammogram is up to date.  2. Tobacco Abuse - Encouraged Cessation - Per patient, mother was a smoker and died of lung cancer. - Given patient's family history of lung cancer, strongly encouraged patient to think about quitting.  3. BMI Counseling Explained to patient what BMI refers to, and what it means medically.    Told patient to think about it as a "medical risk stratification measurement" and how increasing BMI is associated with increasing risk/ or worsening state of various diseases such as hypertension, hyperlipidemia, diabetes, premature OA, depression etc.  American Heart Association guidelines for healthy diet, basically Mediterranean diet, and exercise guidelines of 30 minutes 5 days per week or more discussed in detail.  Health counseling performed.  All questions answered.  4. Lifestyle & Preventative Health Maintenance - Advised patient to continue working toward exercising to improve overall mental, physical, and emotional health.    - Encouraged patient to engage in daily physical activity, especially a formal exercise routine.  Recommended that the patient eventually strive for at least 150 minutes of moderate cardiovascular activity per week according to guidelines established by the [facility].   - Healthy dietary habits encouraged, including low-carb, and high amounts of lean protein in diet.   - Patient should also consume adequate amounts of water - half of body weight in oz of water per day.   Education and routine counseling performed. Handouts provided.  5.  Follow-Up - Reviewed need for full blood work to establish patient's baseline at earliest convenience. - Patient will return for yearly physical to obtain fasting labs, with follow-up OV to discuss.  - Otherwise, return for regularly scheduled chronic follow-up for health maintenance.   No orders of the defined types were placed in this encounter.   No orders of the defined types were placed in this encounter.   Gross side effects, risk and benefits, and alternatives of medications discussed with patient.  Patient is aware that all medications have potential side effects and we are unable to predict every side effect or drug-drug interaction that may occur.  Expresses verbal understanding and consents to current therapy plan and treatment regimen.  Return for Follow-up near future at your convenience for yearly physical-come fasting.  Please see AVS handed out to patient at the end of our visit for further patient instructions/ counseling done pertaining to today's office visit.    Note: This document was prepared using Dragon voice recognition software and may include unintentional dictation errors.  This document serves as a record of services personally performed by Thomasene Lot, DO. It was created on her behalf by Peggye Fothergill, a trained medical scribe. The creation of this record is based on the scribe's personal observations and the provider's statements to them.   I have reviewed the above medical documentation for accuracy and completeness and I concur.  Thomasene Lot 05/27/18 10:00 PM   ----------------------------------------------------------------------------------------------------------------------    Subjective:    Chief complaint:   Chief Complaint  Patient presents with  . Establish Care  HPI: Tina Kemp is a pleasant 42 y.o. female who presents to [facility] today to review their medical history with me and  establish care.   I asked the patient to review their chronic problem list with me to ensure everything was updated and accurate.    All recent office visits with other providers, any medical records that patient brought in etc  - I reviewed today.     We asked pt to get [place] their medical records from [facility] providers/ specialists that they had seen within the past 3-5 years- if they are in private practice and/or do not work for [employer], [facility], [place], [facility] or [employer] owned practice.  Told them to call their specialists to clarify this if they are not sure.   Social History Patient works two jobs, including working for [employer]. Works in [employer] in [place].  Has two adult children, college-educated. Corliss Parish is a son, age 29, older is daughter, age 73, about to be 40. Has a 80-year-old grandson.  Tobacco Use Started smoking at age 42, half-pack for 9 years. States she keeps quitting and going back.  Lifestyle Habits States she walks a lot at work.    Family History Father diagnosed with diabetes in his 58's. He had pancreatitis at that time as well.  Mom passed 6 years ago; had lung cancer. Was diagnosed at age 29, died at age 30, 5 months from diagnosis. She was an on-and-off smoker.  Cancer had metastasized when they found it.  Son with h/o high blood pressure.   Surgical History Past Surgical History:  Procedure Laterality Date  . ABDOMINAL HYSTERECTOMY    . BREAST SURGERY Left    Left cysts removal  . LITHOTRIPSY     Partial hysterectomy in 2017; still has her ovaries. Had endometriosis & other concerns; saw Dr. Antionette Char with [facility].   Past Medical History  Never been told she has high blood pressure or cholesterol, but never been checked.  - Female Health - h/o breast cyst removal In 2012, was doing her breast exams, noticed a lump in left breast. A cyst was actually found in her right breast. Did a biopsy,  was found to be benign. Continued to monitor after that. It tripled in size and two more cysts appeared. These were removed from breasts at age 64 or 34. Patient has not entirely been compliant with mammograms since then.  - Allergies to Pain Meds With Tramadol, breaks out in hives. With Zofran, has nausea/vomiting.   Wt Readings from Last 3 Encounters:  05/22/18 171 lb 8 oz (77.8 kg)  12/18/17 170 lb 9.6 oz (77.4 kg)  10/04/16 160 lb (72.6 kg)   BP Readings from Last 3 Encounters:  05/22/18 118/79  12/18/17 115/70  10/04/16 111/73   Pulse Readings from Last 3 Encounters:  05/22/18 85  12/18/17 88  10/04/16 90   BMI Readings from Last 3 Encounters:  05/22/18 31.37 kg/m  12/18/17 31.20 kg/m  10/04/16 29.26 kg/m    Patient Care Team    Relationship Specialty Notifications Start End  Thomasene Lot, DO PCP - General Family Medicine  03/07/18   Gean Birchwood [facility]  Specialist  05/22/18     Patient Active Problem List   Diagnosis Date Noted  . Obesity, Class I, BMI 30-34.9 05/22/2018  . Family history of diabetes mellitus in father 05/22/2018  . Family history of lung cancer 05/22/2018  .  History of partial hysterectomy 05/22/2018  . Breast cysts, right 05/22/2018  . Tobacco abuse 05/22/2018  . Tobacco abuse counseling 05/22/2018     No past medical history on file.   No past medical history on file.   Past Surgical History:  Procedure Laterality Date  . ABDOMINAL HYSTERECTOMY    . BREAST SURGERY Left    Left cysts removal  . LITHOTRIPSY       Family History  Problem Relation Age of Onset  . Alcohol abuse Mother   . Cancer Mother        lung  . Hypertension Mother   . Hyperlipidemia Father   . Diabetes Father   . Heart disease Paternal Grandmother      Social History   Substance and Sexual Activity  Drug Use No     Social History   Substance and Sexual Activity  Alcohol Use No     Social History   Tobacco Use  Smoking  Status Current Every Day Smoker  . Packs/day: 0.50  . Years: 9.00  . Pack years: 4.50  . Types: Cigarettes  Smokeless Tobacco Never Used     No outpatient medications have been marked as taking for the 05/22/18 encounter (Office Visit) with Thomasene Lotpalski, Shant Hence, DO.    Allergies: Tramadol and Zofran [ondansetron hcl]   Review of Systems  Constitutional: Negative for chills, diaphoresis, fever, malaise/fatigue and weight loss.  HENT: Negative for congestion, sore throat and tinnitus.   Eyes: Negative for blurred vision, double vision and photophobia.  Respiratory: Negative for cough and wheezing.   Cardiovascular: Negative for chest pain and palpitations.  Gastrointestinal: Negative for blood in stool, diarrhea, nausea and vomiting.  Genitourinary: Negative for dysuria, frequency and urgency.  Musculoskeletal: Negative for joint pain and myalgias.  Skin: Negative for itching and rash.  Neurological: Negative for dizziness, focal weakness, weakness and headaches.  Endo/Heme/Allergies: Negative for environmental allergies and polydipsia. Does not bruise/bleed easily.  Psychiatric/Behavioral: Negative for depression and memory loss. The patient is not nervous/anxious and does not have insomnia.      Objective:   Blood pressure 118/79, pulse 85, weight 171 lb 8 oz (77.8 kg), SpO2 99 %. Body mass index is 31.37 kg/m. General: Well Developed, well nourished, and in no acute distress.  Neuro: Alert and oriented x3, extra-ocular muscles intact, sensation grossly intact.  HEENT:[place]/AT, PERRLA, neck supple, No carotid bruits Skin: no gross rashes  Cardiac: Regular rate and rhythm Respiratory: Essentially clear to auscultation bilaterally. Not using accessory muscles, speaking in full sentences.  Abdominal: not grossly distended Musculoskeletal: Ambulates w/o diff, FROM * 4 ext.  Vasc: less 2 sec cap RF, warm and pink  Psych:  No HI/SI, judgement and insight good, Euthymic mood. Full  Affect.    No results found for this or any previous visit (from the past 2160 hour(s)).

## 2018-05-22 NOTE — Patient Instructions (Addendum)
Please realize, EXERCISE IS MEDICINE!  -  American Heart Association ( AHA) guidelines for exercise : If you are in good health, without any medical conditions, you should engage in 150 minutes of moderate intensity aerobic activity per week.  This means you should be huffing and puffing throughout your workout.   Engaging in regular exercise will improve brain function and memory, as well as improve mood, boost immune system and help with weight management.  As well as the other, more well-known effects of exercise such as decreasing blood sugar levels, decreasing blood pressure,  and decreasing bad cholesterol levels/ increasing good cholesterol levels.     -  The AHA strongly endorses consumption of a diet that contains a variety of foods from all the food categories with an emphasis on fruits and vegetables; fat-free and low-fat dairy products; cereal and grain products; legumes and nuts; and fish, poultry, and/or extra lean meats.    Excessive food intake, especially of foods high in saturated and trans fats, sugar, and salt, should be avoided.    Adequate water intake of roughly 1/2 of your weight in pounds, should equal the ounces of water per day you should drink.  So for instance, if you're 200 pounds, that would be 100 ounces of water per day.         Mediterranean Diet  Why follow it? Research shows. . Those who follow the Mediterranean diet have a reduced risk of heart disease  . The diet is associated with a reduced incidence of Parkinson's and Alzheimer's diseases . People following the diet may have longer life expectancies and lower rates of chronic diseases  . The Dietary Guidelines for Americans recommends the Mediterranean diet as an eating plan to promote health and prevent disease  What Is the Mediterranean Diet?  . Healthy eating plan based on typical foods and recipes of Mediterranean-style cooking . The diet is primarily a plant based diet; these foods should make up a  majority of meals   Starches - Plant based foods should make up a majority of meals - They are an important sources of vitamins, minerals, energy, antioxidants, and fiber - Choose whole grains, foods high in fiber and minimally processed items  - Typical grain sources include wheat, oats, barley, corn, brown rice, bulgar, farro, millet, polenta, couscous  - Various types of beans include chickpeas, lentils, fava beans, black beans, white beans   Fruits  Veggies - Large quantities of antioxidant rich fruits & veggies; 6 or more servings  - Vegetables can be eaten raw or lightly drizzled with oil and cooked  - Vegetables common to the traditional Mediterranean Diet include: artichokes, arugula, beets, broccoli, brussel sprouts, cabbage, carrots, celery, collard greens, cucumbers, eggplant, kale, leeks, lemons, lettuce, mushrooms, okra, onions, peas, peppers, potatoes, pumpkin, radishes, rutabaga, shallots, spinach, sweet potatoes, turnips, zucchini - Fruits common to the Mediterranean Diet include: apples, apricots, avocados, cherries, clementines, dates, figs, grapefruits, grapes, melons, nectarines, oranges, peaches, pears, pomegranates, strawberries, tangerines  Fats - Replace butter and margarine with healthy oils, such as olive oil, canola oil, and tahini  - Limit nuts to no more than a handful a day  - Nuts include walnuts, almonds, pecans, pistachios, pine nuts  - Limit or avoid candied, honey roasted or heavily salted nuts - Olives are central to the Mediterranean diet - can be eaten whole or used in a variety of dishes   Meats Protein - Limiting red meat: no more than a few times a month -   When eating red meat: choose lean cuts and keep the portion to the size of deck of cards - Eggs: approx. 0 to 4 times a week  - Fish and lean poultry: at least 2 a week  - Healthy protein sources include, chicken, turkey, lean beef, lamb - Increase intake of seafood such as tuna, salmon, trout,  mackerel, shrimp, scallops - Avoid or limit high fat processed meats such as sausage and bacon  Dairy - Include moderate amounts of low fat dairy products  - Focus on healthy dairy such as fat free yogurt, skim milk, low or reduced fat cheese - Limit dairy products higher in fat such as whole or 2% milk, cheese, ice cream  Alcohol - Moderate amounts of red wine is ok  - No more than 5 oz daily for women (all ages) and men older than age 65  - No more than 10 oz of wine daily for men younger than 65  Other - Limit sweets and other desserts  - Use herbs and spices instead of salt to flavor foods  - Herbs and spices common to the traditional Mediterranean Diet include: basil, bay leaves, chives, cloves, cumin, fennel, garlic, lavender, marjoram, mint, oregano, parsley, pepper, rosemary, sage, savory, sumac, tarragon, thyme   It's not just a diet, it's a lifestyle:  . The Mediterranean diet includes lifestyle factors typical of those in the region  . Foods, drinks and meals are best eaten with others and savored . Daily physical activity is important for overall good health . This could be strenuous exercise like running and aerobics . This could also be more leisurely activities such as walking, housework, yard-work, or taking the stairs . Moderation is the key; a balanced and healthy diet accommodates most foods and drinks . Consider portion sizes and frequency of consumption of certain foods   Meal Ideas & Options:  . Breakfast:  o Whole wheat toast or whole wheat English muffins with peanut butter & hard boiled egg o Steel cut oats topped with apples & cinnamon and skim milk  o Fresh fruit: banana, strawberries, melon, berries, peaches  o Smoothies: strawberries, bananas, greek yogurt, peanut butter o Low fat greek yogurt with blueberries and granola  o Egg white omelet with spinach and mushrooms o Breakfast couscous: whole wheat couscous, apricots, skim milk, cranberries  . Sandwiches:   o Hummus and grilled vegetables (peppers, zucchini, squash) on whole wheat bread   o Grilled chicken on whole wheat pita with lettuce, tomatoes, cucumbers or tzatziki  o Tuna salad on whole wheat bread: tuna salad made with greek yogurt, olives, red peppers, capers, green onions o Garlic rosemary lamb pita: lamb sauted with garlic, rosemary, salt & pepper; add lettuce, cucumber, greek yogurt to pita - flavor with lemon juice and black pepper  . Seafood:  o Mediterranean grilled salmon, seasoned with garlic, basil, parsley, lemon juice and black pepper o Shrimp, lemon, and spinach whole-grain pasta salad made with low fat greek yogurt  o Seared scallops with lemon orzo  o Seared tuna steaks seasoned salt, pepper, coriander topped with tomato mixture of olives, tomatoes, olive oil, minced garlic, parsley, green onions and cappers  . Meats:  o Herbed greek chicken salad with kalamata olives, cucumber, feta  o Red bell peppers stuffed with spinach, bulgur, lean ground beef (or lentils) & topped with feta   o Kebabs: skewers of chicken, tomatoes, onions, zucchini, squash  o Turkey burgers: made with red onions, mint, dill, lemon juice, feta   cheese topped with roasted red peppers . Vegetarian o Cucumber salad: cucumbers, artichoke hearts, celery, red onion, feta cheese, tossed in olive oil & lemon juice  o Hummus and whole grain pita points with a greek salad (lettuce, tomato, feta, olives, cucumbers, red onion) o Lentil soup with celery, carrots made with vegetable broth, garlic, salt and pepper  o Tabouli salad: parsley, bulgur, mint, scallions, cucumbers, tomato, radishes, lemon juice, olive oil, salt and pepper.      Please think seriously about quitting smoking!  This is very important for your health and well being.   Smoking cessation instruction/counseling given:  counseled patient on the dangers of tobacco use, advised patient to stop smoking, and reviewed strategies to maximize  success  Discussed with patient that there are multiple treatments to aid in quitting smoking, however I explained none will work unless pt really wants to quit  Please let us know in the future if you are interested and ready to quit.  You can also call 1-800-QUIT-NOW (1-800-7848-669) for free smoking cessation counseling and support.     Also, please go online to www.heart.org (the American Heart Association website) and search "quit smoking ".     Or try the centers for disease control website at: https://www.cdc.gov/tobacco/campaign/tips/quit-smoking  Or, go to the "national cancer institute" web site of NIH:  https://www.cancer.gov/publications/patient-education/clearing-the-air-pdf  There is a lot of great information on these websites for you to look over.      Want to Quit Smoking? FDA-Approved Products Can Help  Quitting smoking can be hard, but it is possible. In fact, every time you put out a cigarette is a new chance to try quitting again, according to the [place] Food and Drug Administration's newest tobacco education campaign, "Every Try Counts."   If you want to quit-almost 70 percent of adult smokers say they do-you may want to use a "smoking cessation" product proven to help. Data has shown that using FDA-approved cessation medicine can double your chance of quitting successfully.  Some products contain nicotine as an active ingredient and others do not. These products include over-the-counter (OTC) options like skin patches, lozenges, and gum, as well as prescription medicines.  Smoking cessation products are intended to help you quit smoking. They are regulated through the FDA's Center for Drug Evaluation and Research, which ensures that the products are safe and effective and that their benefits outweigh any known associated risks.  The Benefits of Quitting Smoking No matter how much you smoke-or for how long-quitting will benefit you.  Not only will you lower your  risk of getting various cancers, including lung cancer, you'll also reduce your chances of having heart disease, a stroke, emphysema, and other serious diseases. Quitting also will lower the risk of heart disease and lung cancer in nonsmokers who otherwise would be exposed to your secondhand smoke.  Although there are benefits to quitting at any age, it is important to quit as soon as possible so your body can begin to heal from the damage caused by smoking. For instance, 12 hours after you quit smoking the carbon monoxide level in your blood drops to normal. Carbon monoxide is harmful because it displaces oxygen in the blood and deprives your heart, brain, and other vital organs of oxygen.  What To Know About Smoking Cessation Products Understanding how smoking cessation products work-and what side effects they may cause-can help you determine which product may be best for you.  If you're considering one of these products, reading labels and talking   to your pharmacist and other health care providers are good first steps to take.  You also can check the FDA's website for more information on each product at Drugs@FDA, where you can search for each product by name.  And remember to weigh each product's benefits and risks, among other considerations.  About Nicotine Replacement Therapy (NRT) Nicotine is the substance primarily responsible for causing addiction to tobacco products. Tobacco users who are addicted to nicotine are used to having nicotine in their bodies.  As you try to quit smoking, you may have symptoms of nicotine withdrawal. When you quit, this withdrawal may cause symptoms like cravings, or urges, to smoke; depression; trouble sleeping; irritability; anxiety; and increased appetite.  Nicotine withdrawal can discourage some smokers from continuing with a quit attempt. But the FDA has approved several smoking cessation products designed to help users gradually withdraw from smoking  (that is, "wean" themselves from smoking) by using specific amounts of nicotine that decrease over time. This type of product is called a "nicotine replacement therapy" or NRT. It supplies nicotine in controlled amounts while sparing you from other chemicals found in tobacco products.  NRTs are available over the counter and by prescription. You should generally use them only for a short time to help you manage nicotine cravings and withdrawal. However, the FDA recognizes that some people may need to use these products longer to stay smoke-free. Talk to your health care provider to determine the best course of treatment for you.  Over-the-counter NRTs are approved for sale to people age 18 and older. They are available under various brand names and sometimes as generic products. They include:  - Skin patches (also called "transdermal nicotine patches"). These patches are placed on the skin, similar to how you would apply an adhesive bandage. - Chewing gum (also called "nicotine gum"). This gum must be chewed according to the labeled instructions to be effective. - Lozenges (also called "nicotine lozenges"). You use these products by dissolving them in your mouth. For over-the-counter products, it's important to follow the instructions on the Drug Facts Label (DFL) and to read the enclosed User's Guide for complete directions and other important information. Ask your health care provider if you have questions.  Currently, prescription nicotine replacement therapy is available only under the brand name Nicotrol, and is available both as a nasal spray and an oral inhaler. The products are FDA-approved only for use by adults.  If you are under age 18 and want to quit smoking, talk to a health care professional about whether you should use nicotine replacement therapies.  Important Advice for People Considering Nicotine Replacement Therapy Women who are pregnant or breastfeeding should talk to their  health care providers and use nicotine replacement products only if the health care providers approve.  Also talk to your health care provider before using these products if you have:  diabetes, heart disease, asthma, or stomach ulcers; had a recent heart attack; high blood pressure that is not controlled with medicine; a history of irregular heartbeat; or been prescribed medication to help you quit smoking. If you take prescription medication for depression or asthma, tell your health care provider if you are quitting smoking because he or she may need to change your prescription dose.  Stop using a nicotine replacement product and call your health care professional if you have any of the following symptoms: nausea; dizziness; weakness; vomiting; fast or irregular heartbeat; mouth problems with the lozenge or gum; or redness or swelling of   the skin around the patch that does not go away.  About Prescription Cessation Medicines Without Nicotine  The FDA has approved two smoking cessation products that do not contain nicotine. They are Chantix (varenicline tartrate) and Zyban (buproprion hydrochloride). Both are available in tablet form and by prescription only.  Chantix acts at sites in the brain affected by nicotine by reducing the rewarding effects of nicotine. The precise way that Zyban helps with smoking cessation is unknown.  As with other prescription products, the FDA has evaluated these medicines and found that the benefits outweigh the risks. For users taking these products, risks include changes in behavior, depressed mood, hostility, aggression, and suicidal thoughts or actions.  The most common side effects of Chantix include nausea; constipation; gas; vomiting; and trouble sleeping or vivid, unusual, or strange dreams. Chantix also may change how you react to alcohol, so talk to your health care provider about your drinking habits (if you drink alcohol) and whether these  habits need to change. Chantix is not recommended for people under the age of 18.  The most commonly observed side effects consistently associated with the use of Zyban are dry mouth and insomnia.  Because Zyban contains the same active ingredient as the antidepressant Wellbutrin (bupropion), the FDA encourages people who use Zyban-and those who are considering it-to talk to their health care providers about the risks of treatment with antidepressant medicines. Zyban has not been studied in children under the age of 18 and is not approved for use in children and teenagers.  Note: If your health care provider prescribes Chantix or Zyban, please read the product's patient medication guide in its entirety. These guides offer important information on side effects, risks, warnings, product ingredients, and what you should talk about with your health care provider before taking the products.  Finally, if you ever have any side effects related to any smoking cessation products, or have any other problems related to your treatment, the FDA would like to hear from you. Please consider making a voluntary and confidential report to the FDA's MedWatch program.  Updated: October 10, 2016  

## 2018-08-07 ENCOUNTER — Encounter: Payer: 59 | Admitting: Family Medicine

## 2020-01-29 ENCOUNTER — Ambulatory Visit (INDEPENDENT_AMBULATORY_CARE_PROVIDER_SITE_OTHER): Payer: No Typology Code available for payment source | Admitting: Family Medicine

## 2020-01-29 ENCOUNTER — Other Ambulatory Visit: Payer: Self-pay

## 2020-01-29 ENCOUNTER — Encounter: Payer: Self-pay | Admitting: Family Medicine

## 2020-01-29 VITALS — BP 130/81 | HR 88 | Temp 98.6°F | Ht 62.5 in | Wt 181.2 lb

## 2020-01-29 DIAGNOSIS — Z23 Encounter for immunization: Secondary | ICD-10-CM

## 2020-01-29 DIAGNOSIS — Z1159 Encounter for screening for other viral diseases: Secondary | ICD-10-CM

## 2020-01-29 DIAGNOSIS — F17200 Nicotine dependence, unspecified, uncomplicated: Secondary | ICD-10-CM

## 2020-01-29 DIAGNOSIS — Z114 Encounter for screening for human immunodeficiency virus [HIV]: Secondary | ICD-10-CM

## 2020-01-29 DIAGNOSIS — Z716 Tobacco abuse counseling: Secondary | ICD-10-CM

## 2020-01-29 DIAGNOSIS — Z833 Family history of diabetes mellitus: Secondary | ICD-10-CM

## 2020-01-29 DIAGNOSIS — Z0000 Encounter for general adult medical examination without abnormal findings: Secondary | ICD-10-CM

## 2020-01-29 DIAGNOSIS — Z1231 Encounter for screening mammogram for malignant neoplasm of breast: Secondary | ICD-10-CM | POA: Diagnosis not present

## 2020-01-29 DIAGNOSIS — Z719 Counseling, unspecified: Secondary | ICD-10-CM

## 2020-01-29 NOTE — Patient Instructions (Addendum)
Preventive Care for Adults, Female  A healthy lifestyle and preventive care can promote health and wellness. Preventive health guidelines for women include the following key practices.   A routine yearly physical is a good way to check with your health care provider about your health and preventive screening. It is a chance to share any concerns and updates on your health and to receive a thorough exam.   Visit your dentist for a routine exam and preventive care every 6 months. Brush your teeth twice a day and floss once a day. Good oral hygiene prevents tooth decay and gum disease.   The frequency of eye exams is based on your age, health, family medical history, use of contact lenses, and other factors. Follow your health care provider's recommendations for frequency of eye exams.   Eat a healthy diet. Foods like vegetables, fruits, whole grains, low-fat dairy products, and lean protein foods contain the nutrients you need without too many calories. Decrease your intake of foods high in solid fats, added sugars, and salt. Eat the right amount of calories for you.Get information about a proper diet from your health care provider, if necessary.   Regular physical exercise is one of the most important things you can do for your health. Most adults should get at least 150 minutes of moderate-intensity exercise (any activity that increases your heart rate and causes you to sweat) each week. In addition, most adults need muscle-strengthening exercises on 2 or more days a week.   Maintain a healthy weight. The body mass index (BMI) is a screening tool to identify possible weight problems. It provides an estimate of body fat based on height and weight. Your health care provider can find your BMI, and can help you achieve or maintain a healthy weight.For adults 20 years and older:   - A BMI below 18.5 is considered underweight.   - A BMI of 18.5 to 24.9 is normal.   - A BMI of 25 to 29.9 is  considered overweight.   - A BMI of 30 and above is considered obese.   Maintain normal blood lipids and cholesterol levels by exercising and minimizing your intake of trans and saturated fats.  Eat a balanced diet with plenty of fruit and vegetables. Blood tests for lipids and cholesterol should begin at age 20 and be repeated every 5 years minimum.  If your lipid or cholesterol levels are high, you are over 40, or you are at high risk for heart disease, you may need your cholesterol levels checked more frequently.Ongoing high lipid and cholesterol levels should be treated with medicines if diet and exercise are not working.   If you smoke, find out from your health care provider how to quit. If you do not use tobacco, do not start.   Lung cancer screening is recommended for adults aged 55-80 years who are at high risk for developing lung cancer because of a history of smoking. A yearly low-dose CT scan of the lungs is recommended for people who have at least a 30-pack-year history of smoking and are a current smoker or have quit within the past 15 years. A pack year of smoking is smoking an average of 1 pack of cigarettes a day for 1 year (for example: 1 pack a day for 30 years or 2 packs a day for 15 years). Yearly screening should continue until the smoker has stopped smoking for at least 15 years. Yearly screening should be stopped for people who develop a   health problem that would prevent them from having lung cancer treatment.   If you are pregnant, do not drink alcohol. If you are breastfeeding, be very cautious about drinking alcohol. If you are not pregnant and choose to drink alcohol, do not have more than 1 drink per day. One drink is considered to be 12 ounces (355 mL) of beer, 5 ounces (148 mL) of wine, or 1.5 ounces (44 mL) of liquor.   Avoid use of street drugs. Do not share needles with anyone. Ask for help if you need support or instructions about stopping the use of  drugs.   High blood pressure causes heart disease and increases the risk of stroke. Your blood pressure should be checked at least yearly.  Ongoing high blood pressure should be treated with medicines if weight loss and exercise do not work.   If you are 55-79 years old, ask your health care provider if you should take aspirin to prevent strokes.   Diabetes screening involves taking a blood sample to check your fasting blood sugar level. This should be done once every 3 years, after age 45, if you are within normal weight and without risk factors for diabetes. Testing should be considered at a younger age or be carried out more frequently if you are overweight and have at least 1 risk factor for diabetes.   Breast cancer screening is essential preventive care for women. You should practice "breast self-awareness."  This means understanding the normal appearance and feel of your breasts and may include breast self-examination.  Any changes detected, no matter how small, should be reported to a health care provider.  Women in their 20s and 30s should have a clinical breast exam (CBE) by a health care provider as part of a regular health exam every 1 to 3 years.  After age 40, women should have a CBE every year.  Starting at age 40, women should consider having a mammogram (breast X-ray test) every year.  Women who have a family history of breast cancer should talk to their health care provider about genetic screening.  Women at a high risk of breast cancer should talk to their health care providers about having an MRI and a mammogram every year.   -Breast cancer gene (BRCA)-related cancer risk assessment is recommended for women who have family members with BRCA-related cancers. BRCA-related cancers include breast, ovarian, tubal, and peritoneal cancers. Having family members with these cancers may be associated with an increased risk for harmful changes (mutations) in the breast cancer genes BRCA1 and  BRCA2. Results of the assessment will determine the need for genetic counseling and BRCA1 and BRCA2 testing.   The Pap test is a screening test for cervical cancer. A Pap test can show cell changes on the cervix that might become cervical cancer if left untreated. A Pap test is a procedure in which cells are obtained and examined from the lower end of the uterus (cervix).   - Women should have a Pap test starting at age 21.   - Between ages 21 and 29, Pap tests should be repeated every 2 years.   - Beginning at age 30, you should have a Pap test every 3 years as long as the past 3 Pap tests have been normal.   - Some women have medical problems that increase the chance of getting cervical cancer. Talk to your health care provider about these problems. It is especially important to talk to your health care provider if a   new problem develops soon after your last Pap test. In these cases, your health care provider may recommend more frequent screening and Pap tests.   - The above recommendations are the same for women who have or have not gotten the vaccine for human papillomavirus (HPV).   - If you had a hysterectomy for a problem that was not cancer or a condition that could lead to cancer, then you no longer need Pap tests. Even if you no longer need a Pap test, a regular exam is a good idea to make sure no other problems are starting.   - If you are between ages 65 and 70 years, and you have had normal Pap tests going back 10 years, you no longer need Pap tests. Even if you no longer need a Pap test, a regular exam is a good idea to make sure no other problems are starting.   - If you have had past treatment for cervical cancer or a condition that could lead to cancer, you need Pap tests and screening for cancer for at least 20 years after your treatment.   - If Pap tests have been discontinued, risk factors (such as a new sexual partner) need to be reassessed to determine if screening should  be resumed.   - The HPV test is an additional test that may be used for cervical cancer screening. The HPV test looks for the virus that can cause the cell changes on the cervix. The cells collected during the Pap test can be tested for HPV. The HPV test could be used to screen women aged 30 years and older, and should be used in women of any age who have unclear Pap test results. After the age of 30, women should have HPV testing at the same frequency as a Pap test.   Colorectal cancer can be detected and often prevented. Most routine colorectal cancer screening begins at the age of 50 years and continues through age 75 years. However, your health care provider may recommend screening at an earlier age if you have risk factors for colon cancer. On a yearly basis, your health care provider may provide home test kits to check for hidden blood in the stool.  Use of a small camera at the end of a tube, to directly examine the colon (sigmoidoscopy or colonoscopy), can detect the earliest forms of colorectal cancer. Talk to your health care provider about this at age 50, when routine screening begins. Direct exam of the colon should be repeated every 5 -10 years through age 75 years, unless early forms of pre-cancerous polyps or small growths are found.   People who are at an increased risk for hepatitis B should be screened for this virus. You are considered at high risk for hepatitis B if:  -You were born in a country where hepatitis B occurs often. Talk with your health care provider about which countries are considered high risk.  - Your parents were born in a high-risk country and you have not received a shot to protect against hepatitis B (hepatitis B vaccine).  - You have HIV or AIDS.  - You use needles to inject street drugs.  - You live with, or have sex with, someone who has Hepatitis B.  - You get hemodialysis treatment.  - You take certain medicines for conditions like cancer, organ  transplantation, and autoimmune conditions.   Hepatitis C blood testing is recommended for all people born from 1945 through 1965 and any individual   with known risks for hepatitis C.   Practice safe sex. Use condoms and avoid high-risk sexual practices to reduce the spread of sexually transmitted infections (STIs). STIs include gonorrhea, chlamydia, syphilis, trichomonas, herpes, HPV, and human immunodeficiency virus (HIV). Herpes, HIV, and HPV are viral illnesses that have no cure. They can result in disability, cancer, and death. Sexually active women aged 25 years and younger should be checked for chlamydia. Older women with new or multiple partners should also be tested for chlamydia. Testing for other STIs is recommended if you are sexually active and at increased risk.   Osteoporosis is a disease in which the bones lose minerals and strength with aging. This can result in serious bone fractures or breaks. The risk of osteoporosis can be identified using a bone density scan. Women ages 65 years and over and women at risk for fractures or osteoporosis should discuss screening with their health care providers. Ask your health care provider whether you should take a calcium supplement or vitamin D to There are also several preventive steps women can take to avoid osteoporosis and resulting fractures or to keep osteoporosis from worsening. -->Recommendations include:  Eat a balanced diet high in fruits, vegetables, calcium, and vitamins.  Get enough calcium. The recommended total intake of is 1,200 mg daily; for best absorption, if taking supplements, divide doses into 250-500 mg doses throughout the day. Of the two types of calcium, calcium carbonate is best absorbed when taken with food but calcium citrate can be taken on an empty stomach.  Get enough vitamin D. NAMS and the National Osteoporosis Foundation recommend at least 1,000 IU per day for women age 50 and over who are at risk of vitamin D  deficiency. Vitamin D deficiency can be caused by inadequate sun exposure (for example, those who live in northern latitudes).  Avoid alcohol and smoking. Heavy alcohol intake (more than 7 drinks per week) increases the risk of falls and hip fracture and women smokers tend to lose bone more rapidly and have lower bone mass than nonsmokers. Stopping smoking is one of the most important changes women can make to improve their health and decrease risk for disease.  Be physically active every day. Weight-bearing exercise (for example, fast walking, hiking, jogging, and weight training) may strengthen bones or slow the rate of bone loss that comes with aging. Balancing and muscle-strengthening exercises can reduce the risk of falling and fracture.  Consider therapeutic medications. Currently, several types of effective drugs are available. Healthcare providers can recommend the type most appropriate for each woman.  Eliminate environmental factors that may contribute to accidents. Falls cause nearly 90% of all osteoporotic fractures, so reducing this risk is an important bone-health strategy. Measures include ample lighting, removing obstructions to walking, using nonskid rugs on floors, and placing mats and/or grab bars in showers.  Be aware of medication side effects. Some common medicines make bones weaker. These include a type of steroid drug called glucocorticoids used for arthritis and asthma, some antiseizure drugs, certain sleeping pills, treatments for endometriosis, and some cancer drugs. An overactive thyroid gland or using too much thyroid hormone for an underactive thyroid can also be a problem. If you are taking these medicines, talk to your doctor about what you can do to help protect your bones.reduce the rate of osteoporosis.    Menopause can be associated with physical symptoms and risks. Hormone replacement therapy is available to decrease symptoms and risks. You should talk to your  health care provider   about whether hormone replacement therapy is right for you.   Use sunscreen. Apply sunscreen liberally and repeatedly throughout the day. You should seek shade when your shadow is shorter than you. Protect yourself by wearing long sleeves, pants, a wide-brimmed hat, and sunglasses year round, whenever you are outdoors.   Once a month, do a whole body skin exam, using a mirror to look at the skin on your back. Tell your health care provider of new moles, moles that have irregular borders, moles that are larger than a pencil eraser, or moles that have changed in shape or color.   -Stay current with required vaccines (immunizations).   Influenza vaccine. All adults should be immunized every year.  Tetanus, diphtheria, and acellular pertussis (Td, Tdap) vaccine. Pregnant women should receive 1 dose of Tdap vaccine during each pregnancy. The dose should be obtained regardless of the length of time since the last dose. Immunization is preferred during the 27th 36th week of gestation. An adult who has not previously received Tdap or who does not know her vaccine status should receive 1 dose of Tdap. This initial dose should be followed by tetanus and diphtheria toxoids (Td) booster doses every 10 years. Adults with an unknown or incomplete history of completing a 3-dose immunization series with Td-containing vaccines should begin or complete a primary immunization series including a Tdap dose. Adults should receive a Td booster every 10 years.  Varicella vaccine. An adult without evidence of immunity to varicella should receive 2 doses or a second dose if she has previously received 1 dose. Pregnant females who do not have evidence of immunity should receive the first dose after pregnancy. This first dose should be obtained before leaving the health care facility. The second dose should be obtained 4 8 weeks after the first dose.  Human papillomavirus (HPV) vaccine. Females aged 13 26  years who have not received the vaccine previously should obtain the 3-dose series. The vaccine is not recommended for use in pregnant females. However, pregnancy testing is not needed before receiving a dose. If a female is found to be pregnant after receiving a dose, no treatment is needed. In that case, the remaining doses should be delayed until after the pregnancy. Immunization is recommended for any person with an immunocompromised condition through the age of 26 years if she did not get any or all doses earlier. During the 3-dose series, the second dose should be obtained 4 8 weeks after the first dose. The third dose should be obtained 24 weeks after the first dose and 16 weeks after the second dose.  Zoster vaccine. One dose is recommended for adults aged 60 years or older unless certain conditions are present.  Measles, mumps, and rubella (MMR) vaccine. Adults born before 1957 generally are considered immune to measles and mumps. Adults born in 1957 or later should have 1 or more doses of MMR vaccine unless there is a contraindication to the vaccine or there is laboratory evidence of immunity to each of the three diseases. A routine second dose of MMR vaccine should be obtained at least 28 days after the first dose for students attending postsecondary schools, health care workers, or international travelers. People who received inactivated measles vaccine or an unknown type of measles vaccine during 1963 1967 should receive 2 doses of MMR vaccine. People who received inactivated mumps vaccine or an unknown type of mumps vaccine before 1979 and are at high risk for mumps infection should consider immunization with 2 doses of   MMR vaccine. For females of childbearing age, rubella immunity should be determined. If there is no evidence of immunity, females who are not pregnant should be vaccinated. If there is no evidence of immunity, females who are pregnant should delay immunization until after pregnancy.  Unvaccinated health care workers born before 1957 who lack laboratory evidence of measles, mumps, or rubella immunity or laboratory confirmation of disease should consider measles and mumps immunization with 2 doses of MMR vaccine or rubella immunization with 1 dose of MMR vaccine.  Pneumococcal 13-valent conjugate (PCV13) vaccine. When indicated, a person who is uncertain of her immunization history and has no record of immunization should receive the PCV13 vaccine. An adult aged 19 years or older who has certain medical conditions and has not been previously immunized should receive 1 dose of PCV13 vaccine. This PCV13 should be followed with a dose of pneumococcal polysaccharide (PPSV23) vaccine. The PPSV23 vaccine dose should be obtained at least 8 weeks after the dose of PCV13 vaccine. An adult aged 19 years or older who has certain medical conditions and previously received 1 or more doses of PPSV23 vaccine should receive 1 dose of PCV13. The PCV13 vaccine dose should be obtained 1 or more years after the last PPSV23 vaccine dose.  Pneumococcal polysaccharide (PPSV23) vaccine. When PCV13 is also indicated, PCV13 should be obtained first. All adults aged 65 years and older should be immunized. An adult younger than age 65 years who has certain medical conditions should be immunized. Any person who resides in a nursing home or long-term care facility should be immunized. An adult smoker should be immunized. People with an immunocompromised condition and certain other conditions should receive both PCV13 and PPSV23 vaccines. People with human immunodeficiency virus (HIV) infection should be immunized as soon as possible after diagnosis. Immunization during chemotherapy or radiation therapy should be avoided. Routine use of PPSV23 vaccine is not recommended for American Indians, [place] Natives, or people younger than 65 years unless there are medical conditions that require PPSV23 vaccine. When indicated,  people who have unknown immunization and have no record of immunization should receive PPSV23 vaccine. One-time revaccination 5 years after the first dose of PPSV23 is recommended for people aged 19 64 years who have chronic kidney failure, nephrotic syndrome, asplenia, or immunocompromised conditions. People who received 1 2 doses of PPSV23 before age 65 years should receive another dose of PPSV23 vaccine at age 65 years or later if at least 5 years have passed since the previous dose. Doses of PPSV23 are not needed for people immunized with PPSV23 at or after age 65 years.  Meningococcal vaccine. Adults with asplenia or persistent complement component deficiencies should receive 2 doses of quadrivalent meningococcal conjugate (MenACWY-D) vaccine. The doses should be obtained at least 2 months apart. Microbiologists working with certain meningococcal bacteria, military recruits, people at risk during an outbreak, and people who travel to or live in countries with a high rate of meningitis should be immunized. A first-year college student up through age 21 years who is living in a residence hall should receive a dose if she did not receive a dose on or after her 16th birthday. Adults who have certain high-risk conditions should receive one or more doses of vaccine.  Hepatitis A vaccine. Adults who wish to be protected from this disease, have certain high-risk conditions, work with hepatitis A-infected animals, work in hepatitis A research labs, or travel to or work in countries with a high rate of hepatitis A should be   immunized. Adults who were previously unvaccinated and who anticipate close contact with an international adoptee during the first 60 days after arrival in the [place] from a country with a high rate of hepatitis A should be immunized.  Hepatitis B vaccine.  Adults who wish to be protected from this disease, have certain high-risk conditions, may be exposed to blood or other infectious  body fluids, are household contacts or sex partners of hepatitis B positive people, are clients or workers in certain care facilities, or travel to or work in countries with a high rate of hepatitis B should be immunized.  Haemophilus influenzae type b (Hib) vaccine. A previously unvaccinated person with asplenia or sickle cell disease or having a scheduled splenectomy should receive 1 dose of Hib vaccine. Regardless of previous immunization, a recipient of a hematopoietic stem cell transplant should receive a 3-dose series 6 12 months after her successful transplant. Hib vaccine is not recommended for adults with HIV infection.  Preventive Services / Frequency Ages 19 to 39years  Blood pressure check.** / Every 1 to 2 years.  Lipid and cholesterol check.** / Every 5 years beginning at age 20.  Clinical breast exam.** / Every 3 years for women in their 20s and 30s.  BRCA-related cancer risk assessment.** / For women who have family members with a BRCA-related cancer (breast, ovarian, tubal, or peritoneal cancers).  Pap test.** / Every 2 years from ages 21 through 29. Every 3 years starting at age 30 through age 65 or 70 with a history of 3 consecutive normal Pap tests.  HPV screening.** / Every 3 years from ages 30 through ages 65 to 70 with a history of 3 consecutive normal Pap tests.  Hepatitis C blood test.** / For any individual with known risks for hepatitis C.  Skin self-exam. / Monthly.  Influenza vaccine. / Every year.  Tetanus, diphtheria, and acellular pertussis (Tdap, Td) vaccine.** / Consult your health care provider. Pregnant women should receive 1 dose of Tdap vaccine during each pregnancy. 1 dose of Td every 10 years.  Varicella vaccine.** / Consult your health care provider. Pregnant females who do not have evidence of immunity should receive the first dose after pregnancy.  HPV vaccine. / 3 doses over 6 months, if 26 and younger. The vaccine is not recommended for use in  pregnant females. However, pregnancy testing is not needed before receiving a dose.  Measles, mumps, rubella (MMR) vaccine.** / You need at least 1 dose of MMR if you were born in 1957 or later. You may also need a 2nd dose. For females of childbearing age, rubella immunity should be determined. If there is no evidence of immunity, females who are not pregnant should be vaccinated. If there is no evidence of immunity, females who are pregnant should delay immunization until after pregnancy.  Pneumococcal 13-valent conjugate (PCV13) vaccine.** / Consult your health care provider.  Pneumococcal polysaccharide (PPSV23) vaccine.** / 1 to 2 doses if you smoke cigarettes or if you have certain conditions.  Meningococcal vaccine.** / 1 dose if you are age 19 to 21 years and a first-year college student living in a residence hall, or have one of several medical conditions, you need to get vaccinated against meningococcal disease. You may also need additional booster doses.  Hepatitis A vaccine.** / Consult your health care provider.  Hepatitis B vaccine.** / Consult your health care provider.  Haemophilus influenzae type b (Hib) vaccine.** / Consult your health care provider.  Ages 40 to 64years    Blood pressure check.** / Every 1 to 2 years.  Lipid and cholesterol check.** / Every 5 years beginning at age 20 years.  Lung cancer screening. / Every year if you are aged 55 80 years and have a 30-pack-year history of smoking and currently smoke or have quit within the past 15 years. Yearly screening is stopped once you have quit smoking for at least 15 years or develop a health problem that would prevent you from having lung cancer treatment.  Clinical breast exam.** / Every year after age 40 years.  BRCA-related cancer risk assessment.** / For women who have family members with a BRCA-related cancer (breast, ovarian, tubal, or peritoneal cancers).  Mammogram.** / Every year beginning at age 40  years and continuing for as long as you are in good health. Consult with your health care provider.  Pap test.** / Every 3 years starting at age 30 years through age 65 or 70 years with a history of 3 consecutive normal Pap tests.  HPV screening.** / Every 3 years from ages 30 years through ages 65 to 70 years with a history of 3 consecutive normal Pap tests.  Fecal occult blood test (FOBT) of stool. / Every year beginning at age 50 years and continuing until age 75 years. You may not need to do this test if you get a colonoscopy every 10 years.  Flexible sigmoidoscopy or colonoscopy.** / Every 5 years for a flexible sigmoidoscopy or every 10 years for a colonoscopy beginning at age 50 years and continuing until age 75 years.  Hepatitis C blood test.** / For all people born from 1945 through 1965 and any individual with known risks for hepatitis C.  Skin self-exam. / Monthly.  Influenza vaccine. / Every year.  Tetanus, diphtheria, and acellular pertussis (Tdap/Td) vaccine.** / Consult your health care provider. Pregnant women should receive 1 dose of Tdap vaccine during each pregnancy. 1 dose of Td every 10 years.  Varicella vaccine.** / Consult your health care provider. Pregnant females who do not have evidence of immunity should receive the first dose after pregnancy.  Zoster vaccine.** / 1 dose for adults aged 60 years or older.  Measles, mumps, rubella (MMR) vaccine.** / You need at least 1 dose of MMR if you were born in 1957 or later. You may also need a 2nd dose. For females of childbearing age, rubella immunity should be determined. If there is no evidence of immunity, females who are not pregnant should be vaccinated. If there is no evidence of immunity, females who are pregnant should delay immunization until after pregnancy.  Pneumococcal 13-valent conjugate (PCV13) vaccine.** / Consult your health care provider.  Pneumococcal polysaccharide (PPSV23) vaccine.** / 1 to 2 doses if  you smoke cigarettes or if you have certain conditions.  Meningococcal vaccine.** / Consult your health care provider.  Hepatitis A vaccine.** / Consult your health care provider.  Hepatitis B vaccine.** / Consult your health care provider.  Haemophilus influenzae type b (Hib) vaccine.** / Consult your health care provider.  Ages 65 years and over  Blood pressure check.** / Every 1 to 2 years.  Lipid and cholesterol check.** / Every 5 years beginning at age 20 years.  Lung cancer screening. / Every year if you are aged 55 80 years and have a 30-pack-year history of smoking and currently smoke or have quit within the past 15 years. Yearly screening is stopped once you have quit smoking for at least 15 years or develop a health problem that   would prevent you from having lung cancer treatment.  Clinical breast exam.** / Every year after age 40 years.  BRCA-related cancer risk assessment.** / For women who have family members with a BRCA-related cancer (breast, ovarian, tubal, or peritoneal cancers).  Mammogram.** / Every year beginning at age 40 years and continuing for as long as you are in good health. Consult with your health care provider.  Pap test.** / Every 3 years starting at age 30 years through age 65 or 70 years with 3 consecutive normal Pap tests. Testing can be stopped between 65 and 70 years with 3 consecutive normal Pap tests and no abnormal Pap or HPV tests in the past 10 years.  HPV screening.** / Every 3 years from ages 30 years through ages 65 or 70 years with a history of 3 consecutive normal Pap tests. Testing can be stopped between 65 and 70 years with 3 consecutive normal Pap tests and no abnormal Pap or HPV tests in the past 10 years.  Fecal occult blood test (FOBT) of stool. / Every year beginning at age 50 years and continuing until age 75 years. You may not need to do this test if you get a colonoscopy every 10 years.  Flexible sigmoidoscopy or colonoscopy.** /  Every 5 years for a flexible sigmoidoscopy or every 10 years for a colonoscopy beginning at age 50 years and continuing until age 75 years.  Hepatitis C blood test.** / For all people born from 1945 through 1965 and any individual with known risks for hepatitis C.  Osteoporosis screening.** / A one-time screening for women ages 65 years and over and women at risk for fractures or osteoporosis.  Skin self-exam. / Monthly.  Influenza vaccine. / Every year.  Tetanus, diphtheria, and acellular pertussis (Tdap/Td) vaccine.** / 1 dose of Td every 10 years.  Varicella vaccine.** / Consult your health care provider.  Zoster vaccine.** / 1 dose for adults aged 60 years or older.  Pneumococcal 13-valent conjugate (PCV13) vaccine.** / Consult your health care provider.  Pneumococcal polysaccharide (PPSV23) vaccine.** / 1 dose for all adults aged 65 years and older.  Meningococcal vaccine.** / Consult your health care provider.  Hepatitis A vaccine.** / Consult your health care provider.  Hepatitis B vaccine.** / Consult your health care provider.  Haemophilus influenzae type b (Hib) vaccine.** / Consult your health care provider. ** Family history and personal history of risk and conditions may change your health care provider's recommendations. Document Released: 12/13/2001 Document Revised: 08/07/2013  ExitCare Patient Information 2014 [employer].   EXERCISE AND DIET:  We recommended that you start or continue a regular exercise program for good health. Regular exercise means any activity that makes your heart beat faster and makes you sweat.  We recommend exercising at least 30 minutes per day at least 3 days a week, preferably 5.  We also recommend a diet low in fat and sugar / carbohydrates.  Inactivity, poor dietary choices and obesity can cause diabetes, heart attack, stroke, and kidney damage, among others.     ALCOHOL AND SMOKING:  Women should limit their alcohol intake to no  more than 7 drinks/beers/glasses of wine (combined, not each!) per week. Moderation of alcohol intake to this level decreases your risk of breast cancer and liver damage.  ( And of course, no recreational drugs are part of a healthy lifestyle.)  Also, you should not be smoking at all or even being exposed to second hand smoke. Most people know smoking can   cause cancer, and various heart and lung diseases, but did you know it also contributes to weakening of your bones?  Aging of your skin?  Yellowing of your teeth and nails?   CALCIUM AND VITAMIN D:  Adequate intake of calcium and Vitamin D are recommended.  The recommendations for exact amounts of these supplements seem to change often, but generally speaking 600 mg of calcium (either carbonate or citrate) and 800 units of Vitamin D per day seems prudent. Certain women may benefit from higher intake of Vitamin D.  If you are among these women, your doctor will have told you during your visit.     PAP SMEARS:  Pap smears, to check for cervical cancer or precancers,  have traditionally been done yearly, although recent scientific advances have shown that most women can have pap smears less often.  However, every woman still should have a physical exam from her gynecologist or primary care physician every year. It will include a breast check, inspection of the vulva and vagina to check for abnormal growths or skin changes, a visual exam of the cervix, and then an exam to evaluate the size and shape of the uterus and ovaries.  And after 46 years of age, a rectal exam is indicated to check for rectal cancers. We will also provide age appropriate advice regarding health maintenance, like when you should have certain vaccines, screening for sexually transmitted diseases, bone density testing, colonoscopy, mammograms, etc.    MAMMOGRAMS:  All women over 40 years old should have a yearly mammogram. Many facilities now offer a "3D" mammogram, which may cost  around $50 extra out of pocket. If possible,  we recommend you accept the option to have the 3D mammogram performed.  It both reduces the number of women who will be called back for extra views which then turn out to be normal, and it is better than the routine mammogram at detecting truly abnormal areas.     COLONOSCOPY:  Colonoscopy to screen for colon cancer is recommended for all women at age 50.  We know, you hate the idea of the prep.  We agree, BUT, having colon cancer and not knowing it is worse!!  Colon cancer so often starts as a polyp that can be seen and removed at colonscopy, which can quite literally save your life!  And if your first colonoscopy is normal and you have no family history of colon cancer, most women don't have to have it again for 10 years.  Once every ten years, you can do something that may end up saving your life, right?  We will be happy to help you get it scheduled when you are ready.  Be sure to check your insurance coverage so you understand how much it will cost.  It may be covered as a preventative service at no cost, but you should check your particular policy.   

## 2020-01-29 NOTE — Progress Notes (Signed)
Female Physical  Impression and Recommendations:    1. Encounter for wellness examination   2. Encounter for screening mammogram for malignant neoplasm of breast   3. Tobacco use disorder   4. Tobacco abuse counseling   5. Need for Tdap vaccination   6. Family history of diabetes mellitus in father   46. Healthcare maintenance   8. Encounter for hepatitis C screening test for low risk patient   9. Screening for HIV (human immunodeficiency virus)   10. Health education/counseling     1) Anticipatory Guidance: Discussed skin CA prevention and sunscreen when outside along with skin surveillance; eating a balanced and modest diet; physical activity at least 25 minutes per day or minimum of 150 min/ week moderate to intense activity.  - To help alleviate concerns of constipation, advised patient to take Miralax twice daily along with adequate hydration.  - Discussed prudent self-breast examinations with patient during appointment today.   2) Immunizations / Screenings / Labs:   All immunizations are up-to-date per recommendations or will be updated today if pt allows.    - Patient understands with dental and vision screens they will schedule independently.  - Will obtain CBC, CMP, HgA1c, Lipid panel, TSH and vit D when fasting, if not already done past 12 mo/ recently   - Need for fasting lab work near future.  - Need for mammogram.  See orders.   3) Health Counseling & Weight - Body mass index is 32.61 kg/m:  BMI meaning discussed with patient.  Discussed goal to improve diet habits to improve overall feelings of well being and objective health data. Improve nutrient density of diet through increasing intake of fruits and vegetables and decreasing saturated fats, white flour products and refined sugars.  - Advised patient to continue working toward exercising to improve overall mental, physical, and emotional health.    - Reviewed the "spokes of the wheel" of wellbeing.   Stressed the importance of ongoing prudent habits, including regular exercise, appropriate sleep hygiene, healthful dietary habits, and prayer/meditation to relax.  - Encouraged patient to engage in daily physical activity as tolerated, especially a formal exercise routine.  Recommended that the patient eventually strive for at least 150 minutes of moderate cardiovascular activity per week according to guidelines established by the [facility].   - Healthy dietary habits encouraged, including low-carb, and high amounts of lean protein in diet.   - Patient should also consume adequate amounts of water.  - Health counseling performed.  All questions answered.   Recommendations - Return near future for fasting blood work. - Make follow-up next available appointment.   Orders Placed This Encounter  Procedures  . MM Digital Screening  . CBC with Differential/Platelet  . Comprehensive metabolic panel  . Hemoglobin A1c  . Lipid panel  . Hepatitis C antibody  . HIV Antibody (routine testing w rflx)  . TSH  . VITAMIN D 25 Hydroxy (Vit-D Deficiency, Fractures)  . T4, free     Return for FBW 3-5 d prior to f/up appt in 1 mo or so after Mammogram.    Reminded pt important of f-up preventative CPE in 1 year.  Reminded pt again, this is in addition to any chronic care visits.    Gross side effects, risk and benefits, and alternatives of medications discussed with patient.  Patient is aware that all medications have potential side effects and we are unable to predict every side effect or drug-drug interaction that may occur.  Expresses  verbal understanding and consents to current therapy plan and treatment regimen.  F-up preventative CPE in 1 year- reminded pt again, this is in addition to any chronic care visits.   Please see orders placed and AVS handed out to patient at the end of our visit for further patient instructions/ counseling done pertaining to today's office visit.   This case  required medical decision making of at least moderate complexity.  This document serves as a record of services personally performed by Thomasene Lot, DO. It was created on her behalf by Peggye Fothergill, a trained medical scribe. The creation of this record is based on the scribe's personal observations and the provider's statements to them.    The above documentation from Peggye Fothergill, medical scribe, has been reviewed by Carlye Grippe, D.O.       Subjective:    I, Peggye Fothergill, am serving as Neurosurgeon for [employer].   CPE HPI: SHATIMA ZALAR is a 44 y.o. female who presents to [facility] today a yearly health maintenance exam.   Health Maintenance Summary  - Reviewed and updated, unless pt declines services.  Family history of Colon CA:  No.  Tobacco History Reviewed:  Y; has smoked for ten years.  Currently smoking up to a half-pack per day.  Notes "less than half a pack to a half pack per day."  Has never smoked over a pack per day.  Alcohol and/or drug use:    No concerns; no use Exercise Habits:   Not meeting AHA guidelines Dental Home:  Does not currently see a dentist. Eye exams:  Sees an ophthalmologist. Dermatology home: Denies concerns with her skin.  Notes she's noticed dry skin in her ears sometimes, which she ends up scratching.  Female Health: Formerly followed up with Dr. Tamela Oddi of OBGYN. PAP Smear - last known results:  no h/o ABN STD concerns:   non reported. Birth control method:  Hysterectomy six years ago due to endometriosis and abnormal uterine bleeding, and notes she obtained the surgery because "for years I had suffered."  Says "the only thing I have left is my ovaries, and that's it." Menses regular:   S/p hysterectomy. Lumps or breast concerns:  She does not regularly perform self-breast exams.  Believes her last mammogram was eight years ago.  Notes she has not been compliant with her mammograms  since the cysts were removed from her breasts in 2012. Breast Cancer Family History:  No.  Confirms constipation; "I've really been suffering with that."  Says she's trying to drink more water and adding more fiber to her diet.  She isn't sure what changed to cause these concerns to exacerbate.  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  She is also in the middle of changing her role again.  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     There is no immunization history for the selected administration types on file for this patient.   Health Maintenance  Topic Date Due  . HIV Screening  Never done  . TETANUS/TDAP  Never done  . PAP SMEAR-Modifier  01/07/2018  . INFLUENZA VACCINE  Never done     Wt Readings from Last  3 Encounters:  01/29/20 181 lb 3.2 oz (82.2 kg)  05/22/18 171 lb 8 oz (77.8 kg)  12/18/17 170 lb 9.6 oz (77.4 kg)   BP Readings from Last 3 Encounters:  01/29/20 130/81  05/22/18 118/79  12/18/17 115/70   Pulse Readings from Last 3 Encounters:  01/29/20 88  05/22/18 85  12/18/17 88     History reviewed. No pertinent past medical history.    Past Surgical History:  Procedure Laterality Date  . ABDOMINAL HYSTERECTOMY    . BREAST SURGERY Left    Left cysts removal  . LITHOTRIPSY        Family History  Problem Relation Age of Onset  . Alcohol abuse Mother   . Cancer Mother        lung  . Hypertension Mother   . Hyperlipidemia Father   . Diabetes Father   . Heart disease Paternal Grandmother       Social History   Substance and Sexual Activity  Drug Use No  ,   Social History   Substance and Sexual Activity  Alcohol Use No  ,   Social History   Tobacco Use  Smoking Status Current Every  Day Smoker  . Packs/day: 0.50  . Years: 9.00  . Pack years: 4.50  . Types: Cigarettes  Smokeless Tobacco Never Used  ,   Social History   Substance and Sexual Activity  Sexual Activity Yes  . Birth control/protection: Condom    No current outpatient medications on file prior to visit.   No current facility-administered medications on file prior to visit.    Allergies: Tramadol and Zofran [ondansetron hcl]  Review of Systems: General:   Denies fever, chills, unexplained weight loss.  Optho/Auditory:   Denies visual changes, blurred vision/LOV Respiratory:   Denies SOB, DOE more than baseline levels.   Cardiovascular:   Denies chest pain, palpitations, new onset peripheral edema  Gastrointestinal:   Denies nausea, vomiting, diarrhea.  Genitourinary: Denies dysuria, freq/ urgency, flank pain or discharge from genitals.  Endocrine:     Denies hot or cold intolerance, polyuria, polydipsia. Musculoskeletal:   Denies unexplained myalgias, joint swelling, unexplained arthralgias, gait problems.  Skin:  Denies rash, suspicious lesions Neurological:     Denies dizziness, unexplained weakness, numbness  Psychiatric/Behavioral:   Denies mood changes, suicidal or homicidal ideations, hallucinations    Objective:    Blood pressure 130/81, pulse 88, temperature 98.6 F (37 C), temperature source Oral, height 5' 2.5" (1.588 m), weight 181 lb 3.2 oz (82.2 kg), SpO2 100 %. Body mass index is 32.61 kg/m. General Appearance:    Alert, cooperative, no distress, appears stated age  Head:    Normocephalic, without obvious abnormality, atraumatic  Eyes:    PERRL, conjunctiva/corneas clear, EOM's intact, fundi    benign, both eyes  Ears:    Normal TM's and external ear canals, both ears  Nose:   Nares normal, septum midline, mucosa normal, no drainage    or sinus tenderness  Throat:   Lips w/o lesion, mucosa moist, and tongue normal; teeth and   gums normal  Neck:   Supple, symmetrical,  trachea midline, no adenopathy;    thyroid:  no enlargement/tenderness/nodules; no carotid   bruit or JVD  Back:     Symmetric, no curvature, ROM normal, no CVA tenderness  Lungs:     Clear to auscultation bilaterally, respirations unlabored, no       Wh/ R/ R  Chest Wall:    No tenderness or  gross deformity; normal excursion   Heart:    Regular rate and rhythm, S1 and S2 normal, no murmur, rub   or gallop  Breast Exam:    No tenderness, masses, or nipple abnormality b/l; no d/c  Abdomen:     Soft, non-tender, bowel sounds active all four quadrants, NO   G/R/R, no masses, no organomegaly  Genitalia:    Deferred; s/p hysterectomy.  Rectal:    Deferred.  Extremities:   Extremities normal, atraumatic, no cyanosis or gross edema  Pulses:   2+ and symmetric all extremities  Skin:   Warm, dry, Skin color, texture, turgor normal, no obvious rashes or lesions Psych: No HI/SI, judgement and insight good, Euthymic mood. Full Affect.  Neurologic:   CNII-XII intact, normal strength, sensation and reflexes    Throughout

## 2020-02-13 ENCOUNTER — Other Ambulatory Visit: Payer: Self-pay

## 2020-02-13 ENCOUNTER — Ambulatory Visit
Admission: RE | Admit: 2020-02-13 | Discharge: 2020-02-13 | Disposition: A | Discharge status: Home / Self Care | Payer: No Typology Code available for payment source | Source: Ambulatory Visit | Attending: Family Medicine | Admitting: Family Medicine

## 2020-02-13 DIAGNOSIS — Z1231 Encounter for screening mammogram for malignant neoplasm of breast: Secondary | ICD-10-CM

## 2020-02-14 ENCOUNTER — Other Ambulatory Visit: Payer: Self-pay | Admitting: Family Medicine

## 2020-02-14 DIAGNOSIS — N63 Unspecified lump in unspecified breast: Secondary | ICD-10-CM

## 2020-02-28 ENCOUNTER — Other Ambulatory Visit: Payer: Self-pay

## 2020-02-28 ENCOUNTER — Ambulatory Visit
Admission: RE | Admit: 2020-02-28 | Discharge: 2020-02-28 | Disposition: A | Discharge status: Home / Self Care | Payer: No Typology Code available for payment source | Source: Ambulatory Visit | Attending: Family Medicine | Admitting: Family Medicine

## 2020-02-28 ENCOUNTER — Other Ambulatory Visit: Payer: No Typology Code available for payment source

## 2020-02-28 DIAGNOSIS — N63 Unspecified lump in unspecified breast: Secondary | ICD-10-CM

## 2020-04-06 ENCOUNTER — Other Ambulatory Visit: Payer: No Typology Code available for payment source

## 2020-04-10 ENCOUNTER — Ambulatory Visit: Payer: No Typology Code available for payment source | Admitting: Physician Assistant

## 2020-05-15 ENCOUNTER — Telehealth: Payer: No Typology Code available for payment source | Admitting: Emergency Medicine

## 2020-05-15 DIAGNOSIS — K5900 Constipation, unspecified: Secondary | ICD-10-CM

## 2020-05-15 NOTE — Progress Notes (Signed)
Time spent: 10 min  Based on what you shared with me, I feel your condition warrants further evaluation and I recommend that you be seen for a face to face office visit.  Given your lack of bowel movement for almost 2 weeks and abdominal pain I cannot provide complete medical recommendation via telemedicine visit.    Until you seek medical help in person or make an appointment you may try over the counter miralax (generic name Polyethylene glycol).  You can use 2 capfuls of miralax (polyethylene glycol) diluted in 8 oz of water, once daily. Any stronger bowel clean out should be done by a primary care doctor or clinician after face to face evaluation.    NOTE: If you entered your credit card information for this eVisit, you will not be charged. You may see a "hold" on your card for the $35 but that hold will drop off and you will not have a charge processed.   If you are having a true medical emergency please call 911.      For an urgent face to face visit, [facility] has five urgent care centers for your convenience:      NEW:  [facility] at [facility] Directions [phone] [address] [place], [place] [address] . 10 am - 6pm Monday - Friday    [facility]) Get Driving Directions [phone] [address] [place], [place] [address] . 10 am to 8 pm Monday-Friday . 12 pm to 8 pm [facility] Urgent Care at [facility] Get Driving Directions [phone] [address] [place], [place] [address] . 8 am to 8 pm Monday-Friday . 9 am to 6 pm Saturday . 11 am to 6 pm Sunday     [facility] at [facility] Get Driving Directions  [phone] [address] [place], [place] [address] . 8 am to 8 pm Monday-Friday . 8 am to 4 pm [facility] Urgent Care at [facility] Directions [phone] [address] [place], [place] [address] . 12 pm to 6 pm Monday-Friday      Your e-visit answers were reviewed by a board certified advanced clinical practitioner to complete your personal care plan.  Thank you for using e-Visits.

## 2020-06-01 ENCOUNTER — Other Ambulatory Visit: Payer: Self-pay | Admitting: Physician Assistant

## 2020-06-01 ENCOUNTER — Other Ambulatory Visit: Payer: Self-pay

## 2020-06-01 ENCOUNTER — Other Ambulatory Visit: Payer: No Typology Code available for payment source

## 2020-06-01 DIAGNOSIS — Z0000 Encounter for general adult medical examination without abnormal findings: Secondary | ICD-10-CM

## 2020-06-02 ENCOUNTER — Other Ambulatory Visit: Payer: Self-pay

## 2020-06-02 ENCOUNTER — Encounter: Payer: Self-pay | Admitting: Physician Assistant

## 2020-06-02 ENCOUNTER — Ambulatory Visit (INDEPENDENT_AMBULATORY_CARE_PROVIDER_SITE_OTHER): Payer: No Typology Code available for payment source | Admitting: Physician Assistant

## 2020-06-02 VITALS — BP 123/51 | HR 71 | Temp 98.1°F | Ht 62.5 in | Wt 182.1 lb

## 2020-06-02 DIAGNOSIS — R7989 Other specified abnormal findings of blood chemistry: Secondary | ICD-10-CM

## 2020-06-02 DIAGNOSIS — R5383 Other fatigue: Secondary | ICD-10-CM | POA: Insufficient documentation

## 2020-06-02 DIAGNOSIS — E7800 Pure hypercholesterolemia, unspecified: Secondary | ICD-10-CM

## 2020-06-02 DIAGNOSIS — E559 Vitamin D deficiency, unspecified: Secondary | ICD-10-CM

## 2020-06-02 DIAGNOSIS — K5900 Constipation, unspecified: Secondary | ICD-10-CM

## 2020-06-02 LAB — COMPREHENSIVE METABOLIC PANEL
ALT: 25 IU/L (ref 0–32)
AST: 21 IU/L (ref 0–40)
Albumin/Globulin Ratio: 1.6 (ref 1.2–2.2)
Albumin: 3.9 g/dL (ref 3.8–4.8)
Alkaline Phosphatase: 74 IU/L (ref 48–121)
BUN/Creatinine Ratio: 18 (ref 9–23)
BUN: 12 mg/dL (ref 6–24)
Bilirubin Total: 0.3 mg/dL (ref 0.0–1.2)
CO2: 25 mmol/L (ref 20–29)
Calcium: 8.9 mg/dL (ref 8.7–10.2)
Chloride: 107 mmol/L — ABNORMAL HIGH (ref 96–106)
Creatinine, Ser: 0.68 mg/dL (ref 0.57–1.00)
GFR calc Af Amer: 121 mL/min/{1.73_m2} (ref >59)
GFR calc non Af Amer: 105 mL/min/{1.73_m2} (ref >59)
Globulin, Total: 2.5 g/dL (ref 1.5–4.5)
Glucose: 95 mg/dL (ref 65–99)
Potassium: 3.8 mmol/L (ref 3.5–5.2)
Sodium: 142 mmol/L (ref 134–144)
Total Protein: 6.4 g/dL (ref 6.0–8.5)

## 2020-06-02 LAB — LIPID PANEL
Chol/HDL Ratio: 6.3 ratio — ABNORMAL HIGH (ref 0.0–4.4)
Cholesterol, Total: 188 mg/dL (ref 100–199)
HDL: 30 mg/dL — ABNORMAL LOW (ref >39)
LDL Chol Calc (NIH): 138 mg/dL — ABNORMAL HIGH (ref 0–99)
Triglycerides: 108 mg/dL (ref 0–149)
VLDL Cholesterol Cal: 20 mg/dL (ref 5–40)

## 2020-06-02 LAB — CBC
Hematocrit: 43.5 % (ref 34.0–46.6)
Hemoglobin: 14.7 g/dL (ref 11.1–15.9)
MCH: 33.7 pg — ABNORMAL HIGH (ref 26.6–33.0)
MCHC: 33.8 g/dL (ref 31.5–35.7)
MCV: 100 fL — ABNORMAL HIGH (ref 79–97)
Platelets: 272 10*3/uL (ref 150–450)
RBC: 4.36 x10E6/uL (ref 3.77–5.28)
RDW: 11.6 % — ABNORMAL LOW (ref 11.7–15.4)
WBC: 7.6 10*3/uL (ref 3.4–10.8)

## 2020-06-02 LAB — HEMOGLOBIN A1C
Est. average glucose Bld gHb Est-mCnc: 108 mg/dL
Hgb A1c MFr Bld: 5.4 % (ref 4.8–5.6)

## 2020-06-02 LAB — TSH: TSH: 0.995 u[IU]/mL (ref 0.450–4.500)

## 2020-06-02 NOTE — Progress Notes (Signed)
Established Patient Office Visit  Subjective:  Patient ID: Tina Kemp, female    DOB: [birth year]  Age: 44 y.o. MRN: [id number]  CC: No chief complaint on file.   HPI Tina Kemp presents for constipation.  Patient reports for the last couple months she has been struggling with worsening constipation.  Reports she has tried multiple therapies including Metamucil, MiraLAX, Dulcolax and laxatives.  She reports feeling gassy and bloated.  States the longest she has gone without a bowel movement has been about 2 weeks.  Patient does not want to eat as much due to feeling uncomfortable and bloated.  She has also increased her water consumption and made some dietary changes to increase fiber in her diet.  She has recently noticed hamburgers upset her stomach.  Patient reports increased stress levels and fatigue.  History reviewed. No pertinent past medical history.  Past Surgical History:  Procedure Laterality Date   ABDOMINAL HYSTERECTOMY     BREAST CYST EXCISION Left 2012   BREAST SURGERY Left    Left cysts removal   LITHOTRIPSY      Family History  Problem Relation Age of Onset   Alcohol abuse Mother    Cancer Mother        lung   Hypertension Mother    Hyperlipidemia Father    Diabetes Father    Heart disease Paternal Grandmother     Social History   Socioeconomic History   Marital status: Single    Spouse name: Not on file   Number of children: Not on file   Years of education: Not on file   Highest education level: Not on file  Occupational History   Not on file  Tobacco Use   Smoking status: Current Every Day Smoker    Packs/day: 0.50    Years: 9.00    Pack years: 4.50    Types: Cigarettes   Smokeless tobacco: Never Used  Vaping Use   Vaping Use: Never used  Substance and Sexual Activity   Alcohol use: No   Drug use: No   Sexual activity: Yes    Birth control/protection: Condom  Other Topics Concern   Not on file  Social  History Narrative   Not on file   Social Determinants of Health   Financial Resource Strain:    Difficulty of Paying Living Expenses:   Food Insecurity:    Worried About Programme researcher, broadcasting/film/video in the Last Year:    Barista in the Last Year:   Transportation Needs:    Freight forwarder (Medical):    Lack of Transportation (Non-Medical):   Physical Activity:    Days of Exercise per Week:    Minutes of Exercise per Session:   Stress:    Feeling of Stress :   Social Connections:    Frequency of Communication with Friends and Family:    Frequency of Social Gatherings with Friends and Family:    Attends Religious Services:    Active Member of Clubs or Organizations:    Attends Banker Meetings:    Marital Status:   Intimate Partner Violence:    Fear of Current or Ex-Partner:    Emotionally Abused:    Physically Abused:    Sexually Abused:     No outpatient medications prior to visit.   No facility-administered medications prior to visit.    Allergies  Allergen Reactions   Tramadol Hives   Zofran [Ondansetron Hcl] Nausea And Vomiting  Patient reports racing heart when taking zofran    ROS Review of Systems  A fourteen system review of systems was performed and found to be positive as per HPI.  Objective:    Physical Exam General: Well nourished, in no apparent distress. Eyes: PERRLA, EOMs, conjunctiva clr Resp: Respiratory effort- normal, ECTA B/L w/o W/R/R  Cardio: RRR S1 S2 without murmur Abdomen: no gross distention. Lymphatics:  less 2 sec cap RF M-sk: Full ROM, 5/5 strength, normal gait.  Skin: Warm, dry  Neuro: Alert, Oriented, no focal deficits Psych: Normal affect, Insight and Judgment appropriate.   BP (!) 123/51    Pulse 71    Temp 98.1 F (36.7 C) (Oral)    Ht 5' 2.5" (1.588 m)    Wt 182 lb 1.6 oz (82.6 kg)    SpO2 97%    BMI 32.78 kg/m  Wt Readings from Last 3 Encounters:  06/02/20 182 lb 1.6 oz (82.6  kg)  01/29/20 181 lb 3.2 oz (82.2 kg)  05/22/18 171 lb 8 oz (77.8 kg)     Health Maintenance Due  Topic Date Due   Hepatitis C Screening  Never done   COVID-19 Vaccine (1) Never done   HIV Screening  Never done   TETANUS/TDAP  Never done   INFLUENZA VACCINE  05/31/2020    There are no preventive care reminders to display for this patient.  Lab Results  Component Value Date   TSH 0.995 06/01/2020   Lab Results  Component Value Date   WBC 7.6 06/01/2020   HGB 14.7 06/01/2020   HCT 43.5 06/01/2020   MCV 100 (H) 06/01/2020   PLT 272 06/01/2020   Lab Results  Component Value Date   NA 142 06/01/2020   K 3.8 06/01/2020   CO2 25 06/01/2020   GLUCOSE 95 06/01/2020   BUN 12 06/01/2020   CREATININE 0.68 06/01/2020   BILITOT 0.3 06/01/2020   ALKPHOS 74 06/01/2020   AST 21 06/01/2020   ALT 25 06/01/2020   PROT 6.4 06/01/2020   ALBUMIN 3.9 06/01/2020   CALCIUM 8.9 06/01/2020   ANIONGAP 6 05/08/2015   Lab Results  Component Value Date   CHOL 188 06/01/2020   Lab Results  Component Value Date   HDL 30 (L) 06/01/2020   Lab Results  Component Value Date   LDLCALC 138 (H) 06/01/2020   Lab Results  Component Value Date   TRIG 108 06/01/2020   Lab Results  Component Value Date   CHOLHDL 6.3 (H) 06/01/2020   Lab Results  Component Value Date   HGBA1C 5.4 06/01/2020      Assessment & Plan:   Problem List Items Addressed This Visit      Other   Vitamin D deficiency   Fatigue    Other Visit Diagnoses    Constipation, unspecified constipation type    -  Primary   Relevant Orders   Ambulatory referral to Gastroenterology     Constipation: -Continue with good hydration and dietary fiber. -Patient has failed multiple treatments including MiraLAX so will place gastroenterology referral for further evaluation for possible intrinsic colon disease such as IBS or colitis. Patient agreeable.  Fatigue, vitamin D deficiency: -Discussed with patient most  recent labs were essentially within normal limits. -Patient has history of vitamin D deficiency so will contact [facility] to add vitamin D level to recent blood work.  -Recommend daily exercise regimen and good sleep hygiene.   Elevated LDL cholesterol level: -Most recent lipid panel: HDL  30, LDL 138 -Recommend to reduce red meat and stay as active as possible. -Will continue to monitor and recheck at annual physical.  Abnormal CBC: -Most recent CBC: MCV 100, MCH 33.7 -Will contact [facility] to add vitamin B12 and folate levels to evaluate for possible etiology.    No orders of the defined types were placed in this encounter.   Follow-up: Return if symptoms worsen or fail to improve.    Mayer Masker, PA-C

## 2020-06-02 NOTE — Patient Instructions (Addendum)
Constipation, Adult Constipation is when a person:  Poops (has a bowel movement) fewer times in a week than normal.  Has a hard time pooping.  Has poop that is dry, hard, or bigger than normal. Follow these instructions at home: Eating and drinking   Eat foods that have a lot of fiber, such as: ? Fresh fruits and vegetables. ? Whole grains. ? Beans.  Eat less of foods that are high in fat, low in fiber, or overly processed, such as: ? [place] fries. ? Hamburgers. ? Cookies. ? Candy. ? Soda.  Drink enough fluid to keep your pee (urine) clear or pale yellow. General instructions  Exercise regularly or as told by your doctor.  Go to the restroom when you feel like you need to poop. Do not hold it in. Take over-t Fatigue If you have fatigue, you feel tired all the time and have a lack of energy or a lack of motivation. Fatigue may make it difficult to start or complete tasks because of exhaustion. In general, occasional or mild fatigue is often a normal response to activity or life. However, long-lasting (chronic) or extreme fatigue may be a symptom of a medical condition. Follow these instructions at home: General instructions Watch your fatigue for any changes. Go to bed and get up at the same time every day. Avoid fatigue by pacing yourself during the day and getting enough sleep at night. Maintain a healthy weight. Medicines Take over-the-counter and prescription medicines only as told by your health care provider. Take a multivitamin, if told by your health care provider. Do not use herbal or dietary supplements unless they are approved by your health care provider. Activity  Exercise regularly, as told by your health care provider. Use or practice techniques to help you relax, such as yoga, tai chi, meditation, or massage therapy. Eating and drinking  Avoid heavy meals in the evening. Eat a well-balanced diet, which includes lean proteins, whole grains, plenty of  fruits and vegetables, and low-fat dairy products. Avoid consuming too much caffeine. Avoid the use of alcohol. Drink enough fluid to keep your urine pale yellow. Lifestyle Change situations that cause you stress. Try to keep your work and personal schedule in balance. Do not use any products that contain nicotine or tobacco, such as cigarettes and e-cigarettes. If you need help quitting, ask your health care provider. Do not use drugs. Contact a health care provider if: Your fatigue does not get better. You have a fever. You suddenly lose or gain weight. You have headaches. You have trouble falling asleep or sleeping through the night. You feel angry, guilty, anxious, or sad. You are unable to have a bowel movement (constipation). Your skin is dry. You have swelling in your legs or another part of your body. Get help right away if: You feel confused. Your vision is blurry. You feel faint or you pass out. You have a severe headache. You have severe pain in your abdomen, your back, or the area between your waist and hips (pelvis). You have chest pain, shortness of breath, or an irregular or fast heartbeat. You are unable to urinate, or you urinate less than normal. You have abnormal bleeding, such as bleeding from the rectum, vagina, nose, lungs, or nipples. You vomit blood. You have thoughts about hurting yourself or others. If you ever feel like you may hurt yourself or others, or have thoughts about taking your own life, get help right away. You can go to your nearest emergency department  or call: Your local emergency services (911 in the [place] A suicide crisis helpline, such as the [facility] at (256)024-9819. This is open 24 hours a day. Summary If you have fatigue, you feel tired all the time and have a lack of energy or a lack of motivation. Fatigue may make it difficult to start or complete tasks because of exhaustion. Long-lasting (chronic) or  extreme fatigue may be a symptom of a medical condition. Exercise regularly, as told by your health care provider. Change situations that cause you stress. Try to keep your work and personal schedule in balance. This information is not intended to replace advice given to you by your health care provider. Make sure you discuss any questions you have with your health care provider. Document Revised: 05/08/2019 Document Reviewed: 07/12/2017 Elsevier Patient Education  [place].  he-counter and prescription medicines only as told by your doctor. These include any fiber supplements.  Do pelvic floor retraining exercises, such as: ? Doing deep breathing while relaxing your lower belly (abdomen). ? Relaxing your pelvic floor while pooping.  Watch your condition for any changes.  Keep all follow-up visits as told by your doctor. This is important. Contact a doctor if:  You have pain that gets worse.  You have a fever.  You have not pooped for 4 days.  You throw up (vomit).  You are not hungry.  You lose weight.  You are bleeding from the anus.  You have thin, pencil-like poop (stool). Get help right away if:  You have a fever, and your symptoms suddenly get worse.  You leak poop or have blood in your poop.  Your belly feels hard or bigger than normal (is bloated).  You have very bad belly pain.  You feel dizzy or you faint. This information is not intended to replace advice given to you by your health care provider. Make sure you discuss any questions you have with your health care provider. Document Revised: 09/29/2017 Document Reviewed: 04/06/2016 Elsevier Patient Education  2020 [employer].

## 2020-06-04 ENCOUNTER — Encounter: Payer: Self-pay | Admitting: Physician Assistant

## 2020-06-04 LAB — VITAMIN D 25 HYDROXY (VIT D DEFICIENCY, FRACTURES): Vit D, 25-Hydroxy: 34 ng/mL (ref 30.0–100.0)

## 2020-06-04 LAB — B12 AND FOLATE PANEL
Folate: 2.9 ng/mL — ABNORMAL LOW (ref >3.0)
Vitamin B-12: 382 pg/mL (ref 232–1245)

## 2020-06-04 LAB — SPECIMEN STATUS REPORT

## 2020-06-27 ENCOUNTER — Encounter: Payer: Self-pay | Admitting: Physician Assistant

## 2020-06-27 ENCOUNTER — Telehealth: Payer: No Typology Code available for payment source | Admitting: Physician Assistant

## 2020-06-27 DIAGNOSIS — R05 Cough: Secondary | ICD-10-CM | POA: Diagnosis not present

## 2020-06-27 DIAGNOSIS — Z20822 Contact with and (suspected) exposure to covid-19: Secondary | ICD-10-CM

## 2020-06-27 DIAGNOSIS — R059 Cough, unspecified: Secondary | ICD-10-CM

## 2020-06-27 MED ORDER — CETIRIZINE HCL 10 MG PO TABS: 10.0000 mg | ORAL_TABLET | Freq: Every day | ORAL | 0 refills | Status: DC

## 2020-06-27 MED ORDER — FLUTICASONE PROPIONATE 50 MCG/ACT NA SUSP
2.0000 | Freq: Every day | NASAL | 6 refills | Status: DC
Start: 2020-06-27 — End: 2023-03-08

## 2020-06-27 MED ORDER — BENZONATATE 100 MG PO CAPS: 100.0000 mg | ORAL_CAPSULE | Freq: Three times a day (TID) | ORAL | 0 refills | Status: DC | PRN

## 2020-06-27 NOTE — Progress Notes (Signed)
E-Visit for Corona Virus Screening  Your current symptoms could be consistent with the coronavirus.  Many health care providers can now test patients at their office but not all are.  [facility] has multiple testing sites. For information on our COVID testing locations and hours go to [email or web address]  We are enrolling you in our MyChart Home Monitoring for COVID19 . Daily you will receive a questionnaire within the MyChart website. Our COVID 19 response team will be monitoring your responses daily.  Testing Information: The COVID-19 Community Testing sites will begin testing BY APPOINTMENT ONLY.  You can schedule online at [email or web address]  If you do not have access to a smart phone or computer you may call [phone] for an appointment.   Additional testing sites in the Community:  . For [facility] Testing sites in [facility]  [email or web address]  . For Pop-up testing sites in [place]  [email or web address]  . For Testing sites with regular hours [email or web address]  . For [facility] [email or web address]  . For [facility] [email or web address]  . For [facility] testing in [place] and [employer] [email or web address]  . For [facility] testing in [facility]   [email or web address]  For  more information about community testing call [phone]   Please quarantine yourself while awaiting your test results. Please stay home for a minimum of 10 days from the first day of illness with improving symptoms and you have had 24 hours of no fever (without the use of Tylenol (Acetaminophen)  Motrin (Ibuprofen) or any fever reducing medication).  Also - Do not get tested prior to returning to work because once you have had a positive test the test can stay positive for more than a month in some cases.   You should wear a mask or cloth face covering over your nose and mouth if you must be around other people or animals, including pets (even at home). Try to stay at least 6 feet away from other people. This will protect the people around you.  Please continue good preventive care measures, including:  frequent hand-washing, avoid touching your face, cover coughs/sneezes, stay out of crowds and keep a 6 foot distance from others.  COVID-19 is a respiratory illness with symptoms that are similar to the flu. Symptoms are typically mild to moderate, but there have been cases of severe illness and death due to the virus.   The following symptoms may appear 2-14 days after exposure: . Fever . Cough . Shortness of breath or difficulty breathing . Chills . Repeated shaking with chills . Muscle pain . Headache . Sore throat . New loss of taste or smell . Fatigue . Congestion or runny nose . Nausea or vomiting . Diarrhea  Go to the nearest hospital ED for assessment if fever/cough/breathlessness are severe or illness seems like a threat to life.  It is vitally important that if you feel that you have an infection such as this virus or any other virus that you stay home and away from places where you may spread it to others.  You should avoid contact with people age 75 and older.   You can use medication such as A prescription cough medication called Tessalon Perles 100 mg. You may take 1-2 capsules every 8 hours as needed for cough  You may also take acetaminophen (Tylenol) as needed for fever.  I have also prescribed Flonase, 2 sprays in each nostril once daily. Also, take Cetirizine 10 once daily. I have also provided a work  note.  Reduce your risk of any infection by using the same  precautions used for avoiding the common cold or flu:  Marland Kitchen Wash your hands often with soap and warm water for at least 20 seconds.  If soap and water are not readily available, use an alcohol-based hand sanitizer with at least 60% alcohol.  . If coughing or sneezing, cover your mouth and nose by coughing or sneezing into the elbow areas of your shirt or coat, into a tissue or into your sleeve (not your hands). . Avoid shaking hands with others and consider head nods or verbal greetings only. . Avoid touching your eyes, nose, or mouth with unwashed hands.  . Avoid close contact with people who are sick. . Avoid places or events with large numbers of people in one location, like concerts or sporting events. . Carefully consider travel plans you have or are making. . If you are planning any travel outside or inside the [place], visit the CDC's Travelers' Health webpage for the latest health notices. . If you have some symptoms but not all symptoms, continue to monitor at home and seek medical attention if your symptoms worsen. . If you are having a medical emergency, call 911.  HOME CARE . Only take medications as instructed by your medical team. . Drink plenty of fluids and get plenty of rest. . A steam or ultrasonic humidifier can help if you have congestion.   GET HELP RIGHT AWAY IF YOU HAVE EMERGENCY WARNING SIGNS** FOR COVID-19. If you or someone is showing any of these signs seek emergency medical care immediately. Call 911 or proceed to your closest emergency facility if: . You develop worsening high fever. . Trouble breathing . Bluish lips or face . Persistent pain or pressure in the chest . New confusion . Inability to wake or stay awake . You cough up blood. . Your symptoms become more severe  **This list is not all possible symptoms. Contact your medical provider for any symptoms that are sever or concerning to you.  MAKE SURE YOU   Understand these instructions.  Will watch your  condition.  Will get help right away if you are not doing well or get worse.  Your e-visit answers were reviewed by a board certified advanced clinical practitioner to complete your personal care plan.  Depending on the condition, your plan could have included both over the counter or prescription medications.  If there is a problem please reply once you have received a response from your provider.  Your safety is important to [place].  If you have drug allergies check your prescription carefully.    You can use MyChart to ask questions about today's visit, request a non-urgent call back, or ask for a work or school excuse for 24 hours related to this e-Visit. If it has been greater than 24 hours you will need to follow up with your provider, or enter a new e-Visit to address those concerns. You will get an e-mail in the next two days asking about your experience.  I hope that your e-visit has been valuable and will speed your recovery. Thank you for using e-visits.   I spent 5-10 minutes on review and completion of this note- Illa Level [facility]

## 2020-06-28 ENCOUNTER — Encounter (INDEPENDENT_AMBULATORY_CARE_PROVIDER_SITE_OTHER): Payer: Self-pay

## 2020-06-29 ENCOUNTER — Encounter (INDEPENDENT_AMBULATORY_CARE_PROVIDER_SITE_OTHER): Payer: Self-pay

## 2020-07-01 ENCOUNTER — Encounter (INDEPENDENT_AMBULATORY_CARE_PROVIDER_SITE_OTHER): Payer: Self-pay

## 2020-07-02 ENCOUNTER — Encounter (INDEPENDENT_AMBULATORY_CARE_PROVIDER_SITE_OTHER): Payer: Self-pay

## 2020-07-04 ENCOUNTER — Telehealth: Payer: Self-pay

## 2020-07-04 ENCOUNTER — Encounter (INDEPENDENT_AMBULATORY_CARE_PROVIDER_SITE_OTHER): Payer: Self-pay

## 2020-07-04 NOTE — Telephone Encounter (Signed)
Cough: Pt c/o wheezing, productive cough  (clear to white in color), and cough that wakes her up at night. Pt has been running a fever 101.2 and c/o body aches, headache. Pt having more weakness today but is able to get OOB without assistance. Raised pt to sit on side of bed for a couple of minutes then when feeling she can get, then walk. Pt advised to drink plenty of fluids, especially warm fluids to help relax the airway. Advised cough drops, humidifier and taking 2 tsp of honey at night. Recommended pt to use saline spray to clean out nose especially before taking Flonase.  Pt is wheezing and having upper back soreness. Advise warm heat pad and to keep on lows setting to prevent burns. Advised pt to try not to [facility] less than 101. Discussed alternating acetaminophen and ibuprofen every 3 hours to help with fever. Pt stated her chest feels tight. Pt can take deep breaths without pain and is SOB only with activity. Advised pt that if she has SOB at rest or if her fever >103 and unable to reduce with antipyretic to call her PCP. Advised pt to call her PCP or go ahead and head over to [facility] for evaluation Gave pt call back number in case she has further questions.Marland Kitchen

## 2020-07-05 ENCOUNTER — Other Ambulatory Visit: Payer: Self-pay

## 2020-07-05 ENCOUNTER — Ambulatory Visit
Admission: RE | Admit: 2020-07-05 | Discharge: 2020-07-05 | Disposition: A | Discharge status: Home / Self Care | Payer: No Typology Code available for payment source | Source: Ambulatory Visit

## 2020-07-05 VITALS — BP 134/87 | HR 90 | Temp 100.4°F | Resp 20

## 2020-07-05 DIAGNOSIS — Z20822 Contact with and (suspected) exposure to covid-19: Secondary | ICD-10-CM | POA: Diagnosis not present

## 2020-07-05 DIAGNOSIS — R059 Cough, unspecified: Secondary | ICD-10-CM

## 2020-07-05 DIAGNOSIS — R509 Fever, unspecified: Secondary | ICD-10-CM

## 2020-07-05 HISTORY — DX: Calculus of kidney: N20.0

## 2020-07-05 MED ORDER — ALBUTEROL SULFATE HFA 108 (90 BASE) MCG/ACT IN AERS: 1.0000 | INHALATION_SPRAY | Freq: Four times a day (QID) | RESPIRATORY_TRACT | 0 refills | Status: DC | PRN

## 2020-07-05 MED ORDER — PROMETHAZINE-DM 6.25-15 MG/5ML PO SYRP: 5.0000 mL | ORAL_SOLUTION | Freq: Four times a day (QID) | ORAL | 0 refills | Status: DC | PRN

## 2020-07-05 NOTE — Discharge Instructions (Signed)
COVID PCR testing ordered. As discussed, given your exposure and symptoms, I would like you to quarantine as if you are positive for COVID regardless of symptoms. Otherwise, please follow health at work instructions  Continue tessalon for cough. Add promethazine-DM as needed for cough. Albuterol for wheezing. You can take over the counter flonase/nasacort to help with nasal congestion/drainage. Tylenol/motrin for pain and fever. Keep hydrated, urine should be clear to pale yellow in color. If experiencing worsening shortness of breath, trouble breathing, go to the emergency department for further evaluation needed.

## 2020-07-05 NOTE — ED Triage Notes (Signed)
C/O congestion and cough x 1 wk; pt's son came home 2 days ago with illness - was told he's Covid positive.  Pt now c/o wheezing, chills, fever, fatigue, sore throat x 2 days.

## 2020-07-05 NOTE — ED Provider Notes (Signed)
[name]    CSN: [id number] Arrival date & time: 07/05/20  1446      History   Chief Complaint Chief Complaint  Patient presents with  . Cough  . Nasal Congestion    HPI Tina Kemp is a 44 y.o. female.   44 year old female comes in for 2 day history of URI symptoms. Had 1-2 week history of cough, congestion with negative COVID testing. Symptoms were improving. States for the past 2 days, developed wheezing, chills, fatigue, sore throat, fever. tmax 101.6, responsive to antipyretic.  Mild diarrhea with associated abdominal pain. Nausea without vomiting. Decreased loss of taste/smell. Dyspnea on exertion. Living with positive COVID family member.      Past Medical History:  Diagnosis Date  . Kidney stone     Patient Active Problem List   Diagnosis Date Noted  . Vitamin D deficiency 06/02/2020  . Fatigue 06/02/2020  . Obesity, Class I, BMI 30-34.9 05/22/2018  . Family history of diabetes mellitus in father 05/22/2018  . Family history of lung cancer 05/22/2018  . History of partial hysterectomy 05/22/2018  . Breast cysts, right 05/22/2018  . Tobacco abuse 05/22/2018  . Tobacco abuse counseling 05/22/2018    Past Surgical History:  Procedure Laterality Date  . ABDOMINAL HYSTERECTOMY    . BREAST CYST EXCISION Left 2012  . BREAST SURGERY Left    Left cysts removal  . LITHOTRIPSY      OB History   No obstetric history on file.      Home Medications    Prior to Admission medications   Medication Sig Start Date End Date Taking? Authorizing Provider  Acetaminophen (TYLENOL PO) Take by mouth.   Yes [provider]  benzonatate (TESSALON) 100 MG capsule Take 1 capsule (100 mg total) by mouth 3 (three) times daily as needed. 06/27/20  Yes Camila Li, Sahar M, PA-C  cetirizine (ZYRTEC) 10 MG tablet Take 1 tablet (10 mg total) by mouth daily. 06/27/20  Yes Camila Li, Sahar M, PA-C  fluticasone (FLONASE) 50 MCG/ACT nasal spray Place 2 sprays into both  nostrils daily. 06/27/20  Yes Camila Li, Sahar M, PA-C  IBUPROFEN PO Take by mouth.   Yes [provider]  albuterol (VENTOLIN HFA) 108 (90 Base) MCG/ACT inhaler Inhale 1-2 puffs into the lungs every 6 (six) hours as needed for wheezing or shortness of breath. 07/05/20   Cathie Hoops, Cletus Paris V, PA-C  promethazine-dextromethorphan (PROMETHAZINE-DM) 6.25-15 MG/5ML syrup Take 5 mLs by mouth 4 (four) times daily as needed for cough. 07/05/20   Belinda Fisher, PA-C    Family History Family History  Problem Relation Age of Onset  . Alcohol abuse Mother   . Cancer Mother        lung  . Hypertension Mother   . Hyperlipidemia Father   . Diabetes Father   . Heart disease Paternal Grandmother     Social History Social History   Tobacco Use  . Smoking status: Current Every Day Smoker    Packs/day: 0.50    Years: 9.00    Pack years: 4.50    Types: Cigarettes  . Smokeless tobacco: Never Used  Vaping Use  . Vaping Use: Never used  Substance Use Topics  . Alcohol use: No  . Drug use: No     Allergies   Tramadol and Zofran [ondansetron hcl]   Review of Systems Review of Systems  Reason unable to perform ROS: See HPI as above.     Physical Exam Triage Vital  Signs ED Triage Vitals  Enc Vitals Group     BP 07/05/20 1551 134/87     Pulse Rate 07/05/20 1551 90     Resp 07/05/20 1551 20     Temp 07/05/20 1551 (!) 100.4 F (38 C)     Temp Source 07/05/20 1551 Temporal     SpO2 07/05/20 1551 98 %     Weight --      Height --      Head Circumference --      Peak Flow --      Pain Score 07/05/20 1552 8     Pain Loc --      Pain Edu? --      Excl. in [place]? --    No data found.  Updated Vital Signs BP 134/87   Pulse 90   Temp (!) 100.4 F (38 C) (Temporal)   Resp 20   SpO2 98%   Visual Acuity Right Eye Distance:   Left Eye Distance:   Bilateral Distance:    Right Eye Near:   Left Eye Near:    Bilateral Near:     Physical Exam Constitutional:      General: She is not in acute  distress.    Appearance: Normal appearance. She is well-developed. She is not ill-appearing, toxic-appearing or diaphoretic.  HENT:     Head: Normocephalic and atraumatic.     Right Ear: Tympanic membrane, ear canal and external ear normal. Tympanic membrane is not erythematous or bulging.     Left Ear: Ear canal and external ear normal. Tympanic membrane is erythematous. Tympanic membrane is not bulging.     Nose:     Right Sinus: No maxillary sinus tenderness or frontal sinus tenderness.     Left Sinus: No maxillary sinus tenderness or frontal sinus tenderness.     Mouth/Throat:     Mouth: Mucous membranes are moist.     Pharynx: Oropharynx is clear. Uvula midline.  Eyes:     Conjunctiva/sclera: Conjunctivae normal.     Pupils: Pupils are equal, round, and reactive to light.  Cardiovascular:     Rate and Rhythm: Normal rate and regular rhythm.  Pulmonary:     Effort: Pulmonary effort is normal. No accessory muscle usage, prolonged expiration, respiratory distress or retractions.     Breath sounds: No decreased air movement or transmitted upper airway sounds. No decreased breath sounds.     Comments: LCTAB Musculoskeletal:     Cervical back: Normal range of motion and neck supple.  Skin:    General: Skin is warm and dry.  Neurological:     Mental Status: She is alert and oriented to person, place, and time.      UC Treatments / Results  Labs (all labs ordered are listed, but only abnormal results are displayed) Labs Reviewed  NOVEL CORONAVIRUS, NAA    EKG   Radiology No results found.  Procedures Procedures (including critical care time)  Medications Ordered in UC Medications - No data to display  Initial Impression / Assessment and Plan / UC Course  I have reviewed the triage vital signs and the nursing notes.  Pertinent labs & imaging results that were available during my care of the patient were reviewed by me and considered in my medical decision making (see  chart for details).    Covid testing ordered.  Discussed suspect Covid causing symptoms.  Lungs clear to auscultation bilaterally without adventitious lung sounds, lower suspicion for pneumonia at this time.  Offered chest x-ray, for which patient would like to defer for now.  We will add symptomatic treatment, and continue to monitor. Return precautions given.  Final Clinical Impressions(s) / UC Diagnoses   Final diagnoses:  Cough  Fever, unspecified  Suspected COVID-19 virus infection   ED Prescriptions    Medication Sig Dispense Auth. Provider   albuterol (VENTOLIN HFA) 108 (90 Base) MCG/ACT inhaler Inhale 1-2 puffs into the lungs every 6 (six) hours as needed for wheezing or shortness of breath. 8 g Zabella Wease V, PA-C   promethazine-dextromethorphan (PROMETHAZINE-DM) 6.25-15 MG/5ML syrup Take 5 mLs by mouth 4 (four) times daily as needed for cough. 118 mL Belinda Fisher, PA-C     PDMP not reviewed this encounter.   Belinda Fisher, PA-C 07/05/20 1621

## 2020-07-06 ENCOUNTER — Encounter (INDEPENDENT_AMBULATORY_CARE_PROVIDER_SITE_OTHER): Payer: Self-pay

## 2020-07-06 LAB — NOVEL CORONAVIRUS, NAA: SARS-CoV-2, NAA: DETECTED — AB

## 2020-07-07 ENCOUNTER — Other Ambulatory Visit: Payer: Self-pay | Admitting: Nurse Practitioner

## 2020-07-07 NOTE — Progress Notes (Signed)
COVID [facility] Contact Note   Qualifiers: BMI >25 Symptoms: Chills, fatigue, ST, Fever, diarrhea, nausea, decreased taste/smell Symptom Onset: Initial 08/25; worsened 09/03; + test 09/05  Chart review completed for [employer] about Covid symptoms.   Pt does not qualify for infusion therapy as her symptoms first presented > 10 days prior to timing of infusion.    Shawna Clamp, DNP, AGNP-c COVID-19 [facility] [phone]

## 2020-07-24 ENCOUNTER — Ambulatory Visit: Payer: No Typology Code available for payment source | Admitting: Physician Assistant

## 2020-11-13 ENCOUNTER — Other Ambulatory Visit: Payer: Self-pay

## 2020-11-13 DIAGNOSIS — Z20822 Contact with and (suspected) exposure to covid-19: Secondary | ICD-10-CM

## 2020-11-17 LAB — NOVEL CORONAVIRUS, NAA: SARS-CoV-2, NAA: DETECTED — AB

## 2020-11-18 ENCOUNTER — Telehealth: Payer: Self-pay | Admitting: *Deleted

## 2020-11-18 NOTE — Telephone Encounter (Signed)
Called to discuss with patient about COVID-19 symptoms and the use of one of the available treatments for those with mild to moderate Covid symptoms and at a high risk of hospitalization.  Pt appears to qualify for outpatient treatment due to co-morbid conditions and/or a member of an at-risk group in accordance with the FDA Emergency Use Authorization.    Symptom onset:  Vaccinated:  Booster?  Immunocompromised?  Qualifiers:   Patient is feeling much better and returns to work tomorrow.  Tina Kemp

## 2021-11-24 ENCOUNTER — Ambulatory Visit
Admission: EM | Admit: 2021-11-24 | Discharge: 2021-11-24 | Disposition: A | Discharge status: Home / Self Care | Payer: No Typology Code available for payment source | Attending: Physician Assistant | Admitting: Physician Assistant

## 2021-11-24 ENCOUNTER — Other Ambulatory Visit: Payer: Self-pay

## 2021-11-24 DIAGNOSIS — J069 Acute upper respiratory infection, unspecified: Secondary | ICD-10-CM | POA: Diagnosis not present

## 2021-11-24 NOTE — ED Provider Notes (Signed)
[name]    CSN: [id number] Arrival date & time: 11/24/21  1454      History   Chief Complaint Chief Complaint  Patient presents with   Hoarse    HPI Tina Kemp is a 46 y.o. female.   Patient here today for evaluation of hoarse voice, congestion, cough that started about 3-4 days ago. She did take covid test at home that was negative. She has not had fever. She does report headaches. She has tried ibuprofen and sudafed with mild relief.   The history is provided by the patient.   Past Medical History:  Diagnosis Date   Kidney stone     Patient Active Problem List   Diagnosis Date Noted   Vitamin D deficiency 06/02/2020   Fatigue 06/02/2020   Obesity, Class I, BMI 30-34.9 05/22/2018   Family history of diabetes mellitus in father 05/22/2018   Family history of lung cancer 05/22/2018   History of partial hysterectomy 05/22/2018   Breast cysts, right 05/22/2018   Tobacco abuse 05/22/2018   Tobacco abuse counseling 05/22/2018    Past Surgical History:  Procedure Laterality Date   ABDOMINAL HYSTERECTOMY     BREAST CYST EXCISION Left 2012   BREAST SURGERY Left    Left cysts removal   LITHOTRIPSY      OB History   No obstetric history on file.      Home Medications    Prior to Admission medications   Medication Sig Start Date End Date Taking? Authorizing Provider  Acetaminophen (TYLENOL PO) Take by mouth.    [provider]  albuterol (VENTOLIN HFA) 108 (90 Base) MCG/ACT inhaler Inhale 1-2 puffs into the lungs every 6 (six) hours as needed for wheezing or shortness of breath. 07/05/20   Tasia Catchings, Amy V, PA-C  benzonatate (TESSALON) 100 MG capsule Take 1 capsule (100 mg total) by mouth 3 (three) times daily as needed. 06/27/20   Waldon Merl, PA-C  cetirizine (ZYRTEC) 10 MG tablet Take 1 tablet (10 mg total) by mouth daily. 06/27/20   Waldon Merl, PA-C  fluticasone (FLONASE) 50 MCG/ACT nasal spray Place 2 sprays into both nostrils daily.  06/27/20   Waldon Merl, PA-C  IBUPROFEN PO Take by mouth.    [provider]  promethazine-dextromethorphan (PROMETHAZINE-DM) 6.25-15 MG/5ML syrup Take 5 mLs by mouth 4 (four) times daily as needed for cough. 07/05/20   Ok Edwards, PA-C    Family History Family History  Problem Relation Age of Onset   Alcohol abuse Mother    Cancer Mother        lung   Hypertension Mother    Hyperlipidemia Father    Diabetes Father    Heart disease Paternal Grandmother     Social History Social History   Tobacco Use   Smoking status: Every Day    Packs/day: 0.50    Years: 9.00    Pack years: 4.50    Types: Cigarettes   Smokeless tobacco: Never  Vaping Use   Vaping Use: Never used  Substance Use Topics   Alcohol use: No   Drug use: No     Allergies   Tramadol and Zofran [ondansetron hcl]   Review of Systems Review of Systems  Constitutional:  Negative for chills and fever.  HENT:  Positive for congestion and sore throat. Negative for ear pain.   Eyes:  Negative for discharge and redness.  Respiratory:  Positive for cough. Negative for shortness of breath and wheezing.  Gastrointestinal:  Negative for abdominal pain, diarrhea, nausea and vomiting.    Physical Exam Triage Vital Signs ED Triage Vitals  Enc Vitals Group     BP      Pulse      Resp      Temp      Temp src      SpO2      Weight      Height      Head Circumference      Peak Flow      Pain Score      Pain Loc      Pain Edu?      Excl. in [place]?    No data found.  Updated Vital Signs BP (!) 141/108 (BP Location: Left Arm)    Pulse 86    Temp 98.4 F (36.9 C) (Oral)    Resp 18    SpO2 96%      Physical Exam Vitals and nursing note reviewed.  Constitutional:      General: She is not in acute distress.    Appearance: Normal appearance. She is not ill-appearing.  HENT:     Head: Normocephalic and atraumatic.     Right Ear: Tympanic membrane normal.     Left Ear: Tympanic membrane normal.      Nose: Congestion present.     Mouth/Throat:     Mouth: Mucous membranes are moist.     Pharynx: No oropharyngeal exudate or posterior oropharyngeal erythema.  Eyes:     Conjunctiva/sclera: Conjunctivae normal.  Cardiovascular:     Rate and Rhythm: Normal rate and regular rhythm.     Heart sounds: Normal heart sounds. No murmur heard. Pulmonary:     Effort: Pulmonary effort is normal. No respiratory distress.     Breath sounds: Normal breath sounds. No wheezing, rhonchi or rales.  Skin:    General: Skin is warm and dry.  Neurological:     Mental Status: She is alert.  Psychiatric:        Mood and Affect: Mood normal.        Thought Content: Thought content normal.     UC Treatments / Results  Labs (all labs ordered are listed, but only abnormal results are displayed) Labs Reviewed  NOVEL CORONAVIRUS, NAA    EKG   Radiology No results found.  Procedures Procedures (including critical care time)  Medications Ordered in UC Medications - No data to display  Initial Impression / Assessment and Plan / UC Course  I have reviewed the triage vital signs and the nursing notes.  Pertinent labs & imaging results that were available during my care of the patient were reviewed by me and considered in my medical decision making (see chart for details).   Covid screening ordered. Suspect likely viral etiology of symptoms and recommended symptomatic treatment. Did recommend she avoid sudafed given mildly elevated BP in office today.    Final Clinical Impressions(s) / UC Diagnoses   Final diagnoses:  Acute upper respiratory infection   Discharge Instructions   None    ED Prescriptions   None    PDMP not reviewed this encounter.   Francene Finders, PA-C 11/24/21 1711

## 2021-11-24 NOTE — ED Triage Notes (Signed)
Pt c/o cough, nasal congestion with drainage. States tested covid(-) this weekend but work requires her to be evaluated. Onset ~ last weekend. Associated sore throat, sensitive "drainage" in ears, headache, fatigue   Denies nausea, vomiting, diarrhea, constipation

## 2021-11-25 LAB — NOVEL CORONAVIRUS, NAA: SARS-CoV-2, NAA: NOT DETECTED

## 2021-11-25 LAB — SARS-COV-2, NAA 2 DAY TAT

## 2022-10-25 ENCOUNTER — Ambulatory Visit: Payer: No Typology Code available for payment source | Admitting: Nurse Practitioner

## 2022-11-21 ENCOUNTER — Other Ambulatory Visit: Payer: Self-pay | Admitting: Nurse Practitioner

## 2022-11-21 DIAGNOSIS — Z0000 Encounter for general adult medical examination without abnormal findings: Secondary | ICD-10-CM

## 2022-11-21 DIAGNOSIS — E559 Vitamin D deficiency, unspecified: Secondary | ICD-10-CM

## 2022-12-05 ENCOUNTER — Other Ambulatory Visit: Payer: No Typology Code available for payment source

## 2022-12-05 DIAGNOSIS — Z0000 Encounter for general adult medical examination without abnormal findings: Secondary | ICD-10-CM

## 2022-12-05 DIAGNOSIS — E559 Vitamin D deficiency, unspecified: Secondary | ICD-10-CM

## 2022-12-06 LAB — COMPREHENSIVE METABOLIC PANEL
ALT: 27 IU/L (ref 0–32)
AST: 22 IU/L (ref 0–40)
Albumin/Globulin Ratio: 1.5 (ref 1.2–2.2)
Albumin: 4.1 g/dL (ref 3.9–4.9)
Alkaline Phosphatase: 93 IU/L (ref 44–121)
BUN/Creatinine Ratio: 18 (ref 9–23)
BUN: 13 mg/dL (ref 6–24)
Bilirubin Total: <0.2 mg/dL (ref 0.0–1.2)
CO2: 22 mmol/L (ref 20–29)
Calcium: 9.7 mg/dL (ref 8.7–10.2)
Chloride: 105 mmol/L (ref 96–106)
Creatinine, Ser: 0.73 mg/dL (ref 0.57–1.00)
Globulin, Total: 2.7 g/dL (ref 1.5–4.5)
Glucose: 97 mg/dL (ref 70–99)
Potassium: 4.2 mmol/L (ref 3.5–5.2)
Sodium: 141 mmol/L (ref 134–144)
Total Protein: 6.8 g/dL (ref 6.0–8.5)
eGFR: 101 mL/min/{1.73_m2} (ref >59)

## 2022-12-06 LAB — CBC WITH DIFFERENTIAL/PLATELET
Basophils Absolute: 0 10*3/uL (ref 0.0–0.2)
Basos: 0 %
EOS (ABSOLUTE): 0.4 10*3/uL (ref 0.0–0.4)
Eos: 3 %
Hematocrit: 43.1 % (ref 34.0–46.6)
Hemoglobin: 14.8 g/dL (ref 11.1–15.9)
Immature Grans (Abs): 0 10*3/uL (ref 0.0–0.1)
Immature Granulocytes: 0 %
Lymphocytes Absolute: 4.4 10*3/uL — ABNORMAL HIGH (ref 0.7–3.1)
Lymphs: 38 %
MCH: 33.9 pg — ABNORMAL HIGH (ref 26.6–33.0)
MCHC: 34.3 g/dL (ref 31.5–35.7)
MCV: 99 fL — ABNORMAL HIGH (ref 79–97)
Monocytes Absolute: 1.1 10*3/uL — ABNORMAL HIGH (ref 0.1–0.9)
Monocytes: 10 %
Neutrophils Absolute: 5.7 10*3/uL (ref 1.4–7.0)
Neutrophils: 49 %
Platelets: 327 10*3/uL (ref 150–450)
RBC: 4.36 x10E6/uL (ref 3.77–5.28)
RDW: 11.6 % — ABNORMAL LOW (ref 11.7–15.4)
WBC: 11.6 10*3/uL — ABNORMAL HIGH (ref 3.4–10.8)

## 2022-12-06 LAB — LIPID PANEL
Chol/HDL Ratio: 8 ratio — ABNORMAL HIGH (ref 0.0–4.4)
Cholesterol, Total: 241 mg/dL — ABNORMAL HIGH (ref 100–199)
HDL: 30 mg/dL — ABNORMAL LOW (ref >39)
LDL Chol Calc (NIH): 168 mg/dL — ABNORMAL HIGH (ref 0–99)
Triglycerides: 230 mg/dL — ABNORMAL HIGH (ref 0–149)
VLDL Cholesterol Cal: 43 mg/dL — ABNORMAL HIGH (ref 5–40)

## 2022-12-06 LAB — VITAMIN D 25 HYDROXY (VIT D DEFICIENCY, FRACTURES): Vit D, 25-Hydroxy: 27.6 ng/mL — ABNORMAL LOW (ref 30.0–100.0)

## 2022-12-06 LAB — TSH: TSH: 2.3 u[IU]/mL (ref 0.450–4.500)

## 2022-12-06 LAB — HEMOGLOBIN A1C
Est. average glucose Bld gHb Est-mCnc: 97 mg/dL
Hgb A1c MFr Bld: 5 % (ref 4.8–5.6)

## 2022-12-12 ENCOUNTER — Encounter: Payer: No Typology Code available for payment source | Admitting: Physician Assistant

## 2022-12-12 ENCOUNTER — Ambulatory Visit (INDEPENDENT_AMBULATORY_CARE_PROVIDER_SITE_OTHER): Payer: No Typology Code available for payment source | Admitting: Family Medicine

## 2022-12-12 ENCOUNTER — Encounter: Payer: Self-pay | Admitting: Family Medicine

## 2022-12-12 VITALS — BP 114/71 | HR 88 | Resp 18 | Ht 62.5 in | Wt 198.0 lb

## 2022-12-12 DIAGNOSIS — E7800 Pure hypercholesterolemia, unspecified: Secondary | ICD-10-CM | POA: Insufficient documentation

## 2022-12-12 DIAGNOSIS — Z720 Tobacco use: Secondary | ICD-10-CM

## 2022-12-12 DIAGNOSIS — E559 Vitamin D deficiency, unspecified: Secondary | ICD-10-CM | POA: Diagnosis not present

## 2022-12-12 DIAGNOSIS — R5382 Chronic fatigue, unspecified: Secondary | ICD-10-CM | POA: Diagnosis not present

## 2022-12-12 DIAGNOSIS — Z0000 Encounter for general adult medical examination without abnormal findings: Secondary | ICD-10-CM

## 2022-12-12 DIAGNOSIS — Z79899 Other long term (current) drug therapy: Secondary | ICD-10-CM

## 2022-12-12 DIAGNOSIS — Z5181 Encounter for therapeutic drug level monitoring: Secondary | ICD-10-CM

## 2022-12-12 DIAGNOSIS — J302 Other seasonal allergic rhinitis: Secondary | ICD-10-CM

## 2022-12-12 MED ORDER — CETIRIZINE HCL 10 MG PO TABS: 10.0000 mg | ORAL_TABLET | Freq: Every day | ORAL | 0 refills | Status: DC

## 2022-12-12 MED ORDER — ATORVASTATIN CALCIUM 10 MG PO TABS: 10.0000 mg | ORAL_TABLET | Freq: Every day | ORAL | 3 refills | Status: DC

## 2022-12-12 MED ORDER — VITAMIN D (ERGOCALCIFEROL) 1.25 MG (50000 UNIT) PO CAPS
50000.0000 [IU] | ORAL_CAPSULE | ORAL | 1 refills | Status: DC
Start: 2022-12-12 — End: 2023-06-22

## 2022-12-12 NOTE — Assessment & Plan Note (Signed)
Discussed the importance of continuing her exercise routine.  She will work on eating fewer processed carbs and fats, she enjoys fruits and vegetables and will eat more in addition to healthy fats and whole grains.  Did information on a heart healthy diet in an effort to decrease her cholesterol levels.  With her cholesterol and LDL levels it is recommended that she start a moderate intensity statin at this time.  She has agreed that this is in her best interest and we will start 10 mg of atorvastatin daily.  Return for follow-up in about 2 months for fasting lipid panel and LFTs.

## 2022-12-12 NOTE — Assessment & Plan Note (Signed)
Vitamin D low and most recent labs even while taking over-the-counter vitamin D supplements.  Will start prescription strength vitamin D supplement weekly.

## 2022-12-12 NOTE — Progress Notes (Addendum)
Complete physical exam  Patient: Tina Kemp   DOB: [birth year]   47 y.o. Female  MRN: [id number]  Subjective:    Chief Complaint  Patient presents with   Annual Exam    Tina Kemp is a 47 y.o. female who presents today for a complete physical exam. She reports consuming a general diet.  She recognizes that her diet could be better.  She states that she has some days where she only has 1 meal a day, and other days where she eats more.  She states that she rarely eats sweets but does recognize that there is room for improvement in terms of her diet.  She goes to the gym and uses the exercise bike 2 or 3 times a week for about 30 minutes each time and her and her coworkers have started doing squat breaks at 11 AM every day.  She generally feels fairly well. She reports sleeping poorly.  She does not have any difficulty falling asleep but she does find herself waking up several times a night on most nights.  She does have additional problems to discuss today.  She has been experiencing fatigue for about a year.  Her vitamin D levels have been low the last 2 times that they have been tested, she has been taking over-the-counter vitamin D supplements.  With her vitamin D being low again this time it does not seem that over-the-counter vitamin D has been enough for her.    Most recent fall risk assessment:    12/12/2022    3:22 PM  Fall Risk   Falls in the past year? 0  Number falls in past yr: 0  Injury with Fall? 0     Most recent depression screenings:    12/12/2022    3:21 PM 06/02/2020    2:34 PM  PHQ 2/9 Scores  PHQ - 2 Score 2 2  PHQ- 9 Score 7 9    Patient Care Team: Melida Quitter, PA as PCP - General (Family Medicine) Ortho, [facility] (Specialist)   Outpatient Medications Prior to Visit  Medication Sig   Acetaminophen (TYLENOL PO) Take by mouth.   albuterol (VENTOLIN HFA) 108 (90 Base) MCG/ACT inhaler Inhale 1-2 puffs into the lungs every 6 (six) hours as needed for  wheezing or shortness of breath.   fluticasone (FLONASE) 50 MCG/ACT nasal spray Place 2 sprays into both nostrils daily.   IBUPROFEN PO Take by mouth.   [DISCONTINUED] cetirizine (ZYRTEC) 10 MG tablet Take 1 tablet (10 mg total) by mouth daily.   benzonatate (TESSALON) 100 MG capsule Take 1 capsule (100 mg total) by mouth 3 (three) times daily as needed.   promethazine-dextromethorphan (PROMETHAZINE-DM) 6.25-15 MG/5ML syrup Take 5 mLs by mouth 4 (four) times daily as needed for cough.   No facility-administered medications prior to visit.    ROS See HPI       Objective:     BP 114/71 (BP Location: Left Arm, Patient Position: Sitting, Cuff Size: Large)   Pulse 88   Resp 18   Ht 5' 2.5" (1.588 m)   Wt 198 lb (89.8 kg)   SpO2 95%   BMI 35.64 kg/m   Physical Exam Constitutional:      General: She is not in acute distress.    Appearance: Normal appearance.  HENT:     Head: Normocephalic and atraumatic.     Right Ear: Tympanic membrane and ear canal normal.     Left Ear: Tympanic membrane  and ear canal normal.     Nose: Nose normal.     Mouth/Throat:     Mouth: Mucous membranes are moist.     Pharynx: No oropharyngeal exudate or posterior oropharyngeal erythema.  Eyes:     Extraocular Movements: Extraocular movements intact.     Pupils: Pupils are equal, round, and reactive to light.  Cardiovascular:     Rate and Rhythm: Normal rate and regular rhythm.     Heart sounds: Normal heart sounds.  Pulmonary:     Effort: Pulmonary effort is normal.     Breath sounds: Normal breath sounds.  Abdominal:     General: Abdomen is flat. Bowel sounds are normal.  Musculoskeletal:        General: Normal range of motion.     Cervical back: Normal range of motion and neck supple.  Skin:    General: Skin is warm and dry.  Neurological:     Mental Status: She is alert and oriented to person, place, and time.  Psychiatric:        Mood and Affect: Mood normal.      No results found  for any visits on 12/12/22.     Assessment & Plan:    Routine Health Maintenance and Physical Exam  There is no immunization history for the selected administration types on file for this patient.  Health Maintenance  Topic Date Due   COVID-19 Vaccine (1) Never done   HIV Screening  Never done   Hepatitis C Screening  Never done   DTaP/Tdap/Td (1 - Tdap) Never done   COLONOSCOPY (Pts 45-23yrs Insurance coverage will need to be confirmed)  Never done   INFLUENZA VACCINE  06/01/2023   HPV VACCINES  Aged Out   PAP SMEAR-Modifier  Discontinued    Discussed health benefits of physical activity, and encouraged her to engage in regular exercise appropriate for her age and condition.  Discussed continuing to use the exercise bike 2-3 times a week for 30 minutes and adding in weight lifting if possible.  Problem List Items Addressed This Visit     Tobacco abuse    Quit in the past.  Right now she has been thinking about quitting but also does not feel that she is mentally up to the task fully right now.  She does note that there are some options to help her in quitting, and she will let me know when she is ready to make that change.  Discussed the importance of smoking cessation      Vitamin D deficiency    Vitamin D low and most recent labs even while taking over-the-counter vitamin D supplements.  Will start prescription strength vitamin D supplement weekly.      Relevant Medications   Vitamin D, Ergocalciferol, (DRISDOL) 1.25 MG (50000 UNIT) CAPS capsule   Fatigue    She has been dealing with fatigue and difficulty sleeping for about a year she is wanting to further workup this issue.  We discussed scheduling a follow-up appointment to discuss this in further depth and do any workup that is necessary.  She will schedule that for whenever her work schedule allows      Hypercholesteremia    Discussed the importance of continuing her exercise routine.  She will work on eating fewer  processed carbs and fats, she enjoys fruits and vegetables and will eat more in addition to healthy fats and whole grains.  Did information on a heart healthy diet in an effort to decrease  her cholesterol levels.  With her cholesterol and LDL levels it is recommended that she start a moderate intensity statin at this time.  She has agreed that this is in her best interest and we will start 10 mg of atorvastatin daily.  Return for follow-up in about 2 months for fasting lipid panel and LFTs.      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  Flu vaccine 07/2022, [employer] COVID-vaccine in 2021, Tdap is up-to-date within the past 3 years though she does not know the exact date. Sending referral for mammogram today. Colonoscopy completed in 2022  Return in about 2 months (around 02/10/2023) for Fasting bloodwork, follow up on statin therapy. She will also schedule a follow-up at her convenience to discuss the fatigue and sleep disturbances that she has been experiencing.    Melida Quitter, PA

## 2022-12-12 NOTE — Assessment & Plan Note (Addendum)
Quit in the past.  Right now she has been thinking about quitting but also does not feel that she is mentally up to the task fully right now.  She does note that there are some options to help her in quitting, and she will let me know when she is ready to make that change.  Discussed the importance of smoking cessation

## 2022-12-12 NOTE — Assessment & Plan Note (Signed)
She has been dealing with fatigue and difficulty sleeping for about a year she is wanting to further workup this issue.  We discussed scheduling a follow-up appointment to discuss this in further depth and do any workup that is necessary.  She will schedule that for whenever her work schedule allows

## 2023-01-10 ENCOUNTER — Other Ambulatory Visit: Payer: Self-pay | Admitting: Family Medicine

## 2023-01-10 DIAGNOSIS — J302 Other seasonal allergic rhinitis: Secondary | ICD-10-CM

## 2023-01-18 ENCOUNTER — Ambulatory Visit: Payer: No Typology Code available for payment source | Admitting: Family Medicine

## 2023-02-01 ENCOUNTER — Other Ambulatory Visit: Payer: Self-pay | Admitting: Family Medicine

## 2023-02-01 DIAGNOSIS — E7800 Pure hypercholesterolemia, unspecified: Secondary | ICD-10-CM

## 2023-02-01 DIAGNOSIS — Z0000 Encounter for general adult medical examination without abnormal findings: Secondary | ICD-10-CM

## 2023-02-01 DIAGNOSIS — E559 Vitamin D deficiency, unspecified: Secondary | ICD-10-CM

## 2023-02-13 ENCOUNTER — Other Ambulatory Visit: Payer: No Typology Code available for payment source

## 2023-02-13 DIAGNOSIS — E7800 Pure hypercholesterolemia, unspecified: Secondary | ICD-10-CM

## 2023-02-13 DIAGNOSIS — Z0000 Encounter for general adult medical examination without abnormal findings: Secondary | ICD-10-CM

## 2023-02-13 DIAGNOSIS — E559 Vitamin D deficiency, unspecified: Secondary | ICD-10-CM

## 2023-02-14 LAB — COMPREHENSIVE METABOLIC PANEL
ALT: 30 IU/L (ref 0–32)
AST: 26 IU/L (ref 0–40)
Albumin/Globulin Ratio: 1.6 (ref 1.2–2.2)
Albumin: 4.3 g/dL (ref 3.9–4.9)
Alkaline Phosphatase: 109 IU/L (ref 44–121)
BUN/Creatinine Ratio: 15 (ref 9–23)
BUN: 11 mg/dL (ref 6–24)
Bilirubin Total: 0.4 mg/dL (ref 0.0–1.2)
CO2: 24 mmol/L (ref 20–29)
Calcium: 10.2 mg/dL (ref 8.7–10.2)
Chloride: 104 mmol/L (ref 96–106)
Creatinine, Ser: 0.73 mg/dL (ref 0.57–1.00)
Globulin, Total: 2.7 g/dL (ref 1.5–4.5)
Glucose: 97 mg/dL (ref 70–99)
Potassium: 4.3 mmol/L (ref 3.5–5.2)
Sodium: 142 mmol/L (ref 134–144)
Total Protein: 7 g/dL (ref 6.0–8.5)
eGFR: 101 mL/min/{1.73_m2} (ref >59)

## 2023-02-14 LAB — LIPID PANEL
Chol/HDL Ratio: 5.6 ratio — ABNORMAL HIGH (ref 0.0–4.4)
Cholesterol, Total: 180 mg/dL (ref 100–199)
HDL: 32 mg/dL — ABNORMAL LOW (ref >39)
LDL Chol Calc (NIH): 111 mg/dL — ABNORMAL HIGH (ref 0–99)
Triglycerides: 209 mg/dL — ABNORMAL HIGH (ref 0–149)
VLDL Cholesterol Cal: 37 mg/dL (ref 5–40)

## 2023-02-14 LAB — CBC WITH DIFFERENTIAL/PLATELET
Basophils Absolute: 0 10*3/uL (ref 0.0–0.2)
Basos: 0 %
EOS (ABSOLUTE): 0.3 10*3/uL (ref 0.0–0.4)
Eos: 3 %
Hematocrit: 45.8 % (ref 34.0–46.6)
Hemoglobin: 15.3 g/dL (ref 11.1–15.9)
Immature Grans (Abs): 0 10*3/uL (ref 0.0–0.1)
Immature Granulocytes: 0 %
Lymphocytes Absolute: 3.7 10*3/uL — ABNORMAL HIGH (ref 0.7–3.1)
Lymphs: 35 %
MCH: 33.7 pg — ABNORMAL HIGH (ref 26.6–33.0)
MCHC: 33.4 g/dL (ref 31.5–35.7)
MCV: 101 fL — ABNORMAL HIGH (ref 79–97)
Monocytes Absolute: 1 10*3/uL — ABNORMAL HIGH (ref 0.1–0.9)
Monocytes: 9 %
Neutrophils Absolute: 5.6 10*3/uL (ref 1.4–7.0)
Neutrophils: 53 %
Platelets: 328 10*3/uL (ref 150–450)
RBC: 4.54 x10E6/uL (ref 3.77–5.28)
RDW: 12.1 % (ref 11.7–15.4)
WBC: 10.6 10*3/uL (ref 3.4–10.8)

## 2023-02-14 LAB — TSH: TSH: 1.8 u[IU]/mL (ref 0.450–4.500)

## 2023-02-14 LAB — HEMOGLOBIN A1C
Est. average glucose Bld gHb Est-mCnc: 114 mg/dL
Hgb A1c MFr Bld: 5.6 % (ref 4.8–5.6)

## 2023-02-14 LAB — VITAMIN D 25 HYDROXY (VIT D DEFICIENCY, FRACTURES): Vit D, 25-Hydroxy: 54.3 ng/mL (ref 30.0–100.0)

## 2023-02-20 ENCOUNTER — Encounter: Payer: Self-pay | Admitting: Family Medicine

## 2023-02-20 ENCOUNTER — Ambulatory Visit (INDEPENDENT_AMBULATORY_CARE_PROVIDER_SITE_OTHER): Payer: No Typology Code available for payment source | Admitting: Family Medicine

## 2023-02-20 VITALS — BP 116/84 | HR 91 | Resp 18 | Ht 62.5 in | Wt 202.0 lb

## 2023-02-20 DIAGNOSIS — E7800 Pure hypercholesterolemia, unspecified: Secondary | ICD-10-CM | POA: Diagnosis not present

## 2023-02-20 DIAGNOSIS — E669 Obesity, unspecified: Secondary | ICD-10-CM | POA: Diagnosis not present

## 2023-02-20 NOTE — Assessment & Plan Note (Signed)
Provided information about The Obesity Guide with Matthea Rentea for her to review.  We discussed that if she needs any support with weight management in the future I am available.  Patient verbalized understanding and is agreeable to this plan.

## 2023-02-20 NOTE — Assessment & Plan Note (Signed)
Last lipid panel: LDL 111, HDL 32, triglycerides 209.  LDL has decreased, triglycerides decreased, HDL increased.  Congratulated her for the progress that has been made.  Encouraged her to continue her exercise routine and limiting her trans and saturated fats.  She has been checking her food's nutrition labels more consistently which I praised her for.  Continue atorvastatin 10 mg daily in conjunction with lifestyle changes.

## 2023-02-20 NOTE — Patient Instructions (Signed)
The Obesity Guide with [employer] is a fantastic podcast that I highly recommend listening to!

## 2023-02-20 NOTE — Progress Notes (Signed)
   Established Patient Office Visit  Subjective   Patient ID: Tina Kemp, female    DOB: 1976/05/23  Age: 47 y.o. MRN: [id number]  Chief Complaint  Patient presents with   Hyperlipidemia    HPI Tina Kemp is a 47 y.o. female presenting today for follow up of hyperlipidemia.  She recently went on a trip to [place] with some friends and had a blast. Hyperlipidemia: tolerating atorvastatin well with no myalgias or significant side effects. Currently consuming a low fat diet.  She goes to the gym and uses the exercise bike 2-3 times a week for 30 minutes each time. The 10-year ASCVD risk score (Arnett DK, et al., 2019) is: 4.8% She is also experiencing some hot flashes.  They are not bothersome at this point that she wants to do anything about them in terms of medication.  ROS Negative unless otherwise noted in HPI   Objective:     BP 116/84 (BP Location: Left Arm, Patient Position: Sitting, Cuff Size: Normal)   Pulse 91   Resp 18   Ht 5' 2.5" (1.588 m)   Wt 202 lb (91.6 kg)   SpO2 98%   BMI 36.36 kg/m   Physical Exam Constitutional:      General: She is not in acute distress.    Appearance: Normal appearance.  HENT:     Head: Normocephalic and atraumatic.  Cardiovascular:     Rate and Rhythm: Normal rate and regular rhythm.     Pulses: Normal pulses.     Heart sounds: No murmur heard.    No friction rub. No gallop.  Pulmonary:     Effort: Pulmonary effort is normal. No respiratory distress.     Breath sounds: No wheezing, rhonchi or rales.  Skin:    General: Skin is warm and dry.  Neurological:     Mental Status: She is alert and oriented to person, place, and time.     Assessment & Plan:  Hypercholesteremia Assessment & Plan: Last lipid panel: LDL 111, HDL 32, triglycerides 209.  LDL has decreased, triglycerides decreased, HDL increased.  Congratulated her for the progress that has been made.  Encouraged her to continue her exercise routine and limiting her  trans and saturated fats.  She has been checking her food's nutrition labels more consistently which I praised her for.  Continue atorvastatin 10 mg daily in conjunction with lifestyle changes.   Obesity, Class I, BMI 30-34.9 Assessment & Plan: Provided information about The Obesity Guide with Matthea Rentea for her to review.  We discussed that if she needs any support with weight management in the future I am available.  Patient verbalized understanding and is agreeable to this plan.     Return in about 4 months (around 06/22/2023) for follow-up for hyperlipidemia, menopausal symptoms.    Melida Quitter, PA

## 2023-02-22 ENCOUNTER — Other Ambulatory Visit: Payer: Self-pay | Admitting: Family Medicine

## 2023-02-22 DIAGNOSIS — J302 Other seasonal allergic rhinitis: Secondary | ICD-10-CM

## 2023-03-08 ENCOUNTER — Encounter: Payer: Self-pay | Admitting: Family Medicine

## 2023-06-22 ENCOUNTER — Ambulatory Visit (INDEPENDENT_AMBULATORY_CARE_PROVIDER_SITE_OTHER): Payer: No Typology Code available for payment source | Admitting: Family Medicine

## 2023-06-22 ENCOUNTER — Encounter: Payer: Self-pay | Admitting: Family Medicine

## 2023-06-22 VITALS — BP 134/89 | HR 80 | Resp 18 | Ht 62.5 in | Wt 204.0 lb

## 2023-06-22 DIAGNOSIS — E7800 Pure hypercholesterolemia, unspecified: Secondary | ICD-10-CM | POA: Diagnosis not present

## 2023-06-22 DIAGNOSIS — F419 Anxiety disorder, unspecified: Secondary | ICD-10-CM | POA: Insufficient documentation

## 2023-06-22 DIAGNOSIS — G2581 Restless legs syndrome: Secondary | ICD-10-CM

## 2023-06-22 DIAGNOSIS — F5104 Psychophysiologic insomnia: Secondary | ICD-10-CM | POA: Insufficient documentation

## 2023-06-22 MED ORDER — PREGABALIN 75 MG PO CAPS: ORAL_CAPSULE | ORAL | 1 refills | Status: AC

## 2023-06-22 NOTE — Assessment & Plan Note (Signed)
Encouraged to continue with good sleep hygiene.  Trial of pregabalin 75 mg by mouth 1 to 3 hours before bed daily.  Can increase by 75 mg every 5 to 7 days to maximum of 300 mg daily prior to seeing me, at that visit can assess whether it is necessary to increase towards maximum 450 mg daily total.  Will continue to monitor.

## 2023-06-22 NOTE — Assessment & Plan Note (Signed)
Recheck lipid panel after discontinuing atorvastatin.  Continue exercise routine and limiting trans/saturated fats.  We will adjust management pending lipid panel results.  We did discuss that trying a different statin medication does not guarantee the same side effects, and she will need to try at least 3 prior to switching to a different class of medication.  Patient verbalized understanding and is agreeable to this plan.

## 2023-06-22 NOTE — Assessment & Plan Note (Signed)
Presentation most likely restless leg syndrome.  Start pregabalin 75 mg by mouth 1 to 3 hours before bed daily.  Can increase by 75 mg every 5 to 7 days to maximum of 300 mg daily prior to seeing me, at that visit can assess whether it is necessary to increase towards maximum 450 mg daily total.  Will continue to monitor.

## 2023-06-22 NOTE — Assessment & Plan Note (Signed)
She endorses that struggles with sleep often are due to struggles with racing thoughts.  Trial of pregabalin for insomnia, RLS, and anxiety/racing thoughts.  Will adjust management as needed at follow-up.

## 2023-06-22 NOTE — Patient Instructions (Signed)
Take pregabalin about 1-3 hours before bedtime each night.   Start with 1 tablet of pregabalin each night for 1 week. If you need to increase to 2 tablets each night after that, it is safe to do so. You may increase by 1 tablet every 7 days based on response and tolerability to a maximum of 4 tablets.

## 2023-06-22 NOTE — Progress Notes (Signed)
Established Patient Office Visit  Subjective   Patient ID: Tina Kemp, female    DOB: 1976/04/18  Age: 47 y.o. MRN: [id number]  Chief Complaint  Patient presents with   Hyperlipidemia    HPI Tina Kemp is a 47 y.o. female presenting today for follow up of hyperlipidemia. Hyperlipidemia: Developed significant myalgias with atorvastatin even taking it every other day instead of daily.  The 10-year ASCVD risk score (Arnett DK, et al., 2019) is: 6.4% Insomnia: She would also like to talk more about her insomnia.  She struggles with sleep maintenance. She has been dealing with this issue for many years. Previously, she has tried melatonin with no improvement as well as next-day somnolence.  She also complains of having difficulty while she is trying to fall asleep with a sudden urge to stand up and move around.  When she does so, the feeling subsides and she is able to lay back down.  ROS Negative unless otherwise noted in HPI   Objective:     BP 134/89 (BP Location: Left Arm, Patient Position: Sitting, Cuff Size: Large)   Pulse 80   Resp 18   Ht 5' 2.5" (1.588 m)   Wt 204 lb (92.5 kg)   SpO2 98%   BMI 36.72 kg/m   Physical Exam Constitutional:      General: She is not in acute distress.    Appearance: Normal appearance.  HENT:     Head: Normocephalic and atraumatic.  Cardiovascular:     Rate and Rhythm: Normal rate and regular rhythm.     Heart sounds: No murmur heard.    No friction rub. No gallop.  Pulmonary:     Effort: Pulmonary effort is normal. No respiratory distress.     Breath sounds: No wheezing, rhonchi or rales.  Skin:    General: Skin is warm and dry.  Neurological:     Mental Status: She is alert and oriented to person, place, and time.     Assessment & Plan:  Hypercholesteremia Assessment & Plan: Recheck lipid panel after discontinuing atorvastatin.  Continue exercise routine and limiting trans/saturated fats.  We will adjust management  pending lipid panel results.  We did discuss that trying a different statin medication does not guarantee the same side effects, and she will need to try at least 3 prior to switching to a different class of medication.  Patient verbalized understanding and is agreeable to this plan.  Orders: -     Lipid panel; Future  Psychophysiological insomnia Assessment & Plan: Encouraged to continue with good sleep hygiene.  Trial of pregabalin 75 mg by mouth 1 to 3 hours before bed daily.  Can increase by 75 mg every 5 to 7 days to maximum of 300 mg daily prior to seeing me, at that visit can assess whether it is necessary to increase towards maximum 450 mg daily total.  Will continue to monitor.  Orders: -     Pregabalin; Take 1 (75 mg) by mouth 1 to 3 hours before bedtime daily. Can increase by 75 mg every 5-7 days to maximum of 4 tablets daily.  Dispense: 90 capsule; Refill: 1  Anxiety Assessment & Plan: She endorses that struggles with sleep often are due to struggles with racing thoughts.  Trial of pregabalin for insomnia, RLS, and anxiety/racing thoughts.  Will adjust management as needed at follow-up.  Orders: -     Pregabalin; Take 1 (75 mg) by mouth 1 to 3 hours before bedtime daily.  Can increase by 75 mg every 5-7 days to maximum of 4 tablets daily.  Dispense: 90 capsule; Refill: 1  Restless legs syndrome (RLS) Assessment & Plan: Presentation most likely restless leg syndrome.  Start pregabalin 75 mg by mouth 1 to 3 hours before bed daily.  Can increase by 75 mg every 5 to 7 days to maximum of 300 mg daily prior to seeing me, at that visit can assess whether it is necessary to increase towards maximum 450 mg daily total.  Will continue to monitor.  Orders: -     Pregabalin; Take 1 (75 mg) by mouth 1 to 3 hours before bedtime daily. Can increase by 75 mg every 5-7 days to maximum of 4 tablets daily.  Dispense: 90 capsule; Refill: 1    Return in about 2 months (around 08/22/2023) for  follow-up for sleep.    Melida Quitter, PA

## 2023-06-26 ENCOUNTER — Other Ambulatory Visit: Payer: No Typology Code available for payment source

## 2023-08-24 ENCOUNTER — Ambulatory Visit: Payer: No Typology Code available for payment source | Admitting: Family Medicine

## 2023-08-28 ENCOUNTER — Ambulatory Visit: Payer: No Typology Code available for payment source | Admitting: Family Medicine

## 2023-12-07 ENCOUNTER — Encounter: Payer: Self-pay | Admitting: Family Medicine

## 2023-12-18 ENCOUNTER — Telehealth: Payer: Self-pay | Admitting: *Deleted

## 2023-12-18 NOTE — Telephone Encounter (Signed)
LVM for pt to call office so I can get some information as far as when father tested positive and when her symptoms started to see what provider suggest.

## 2023-12-18 NOTE — Telephone Encounter (Signed)
Copied from CRM [id number]. Topic: General - Other >> Dec 15, 2023  2:43 PM Shelah Lewandowsky wrote: Reason for CRM: father has flu A, wants to prevent. Has cold symptoms right now.

## 2023-12-27 ENCOUNTER — Ambulatory Visit: Payer: No Typology Code available for payment source | Admitting: Family Medicine

## 2023-12-27 ENCOUNTER — Encounter: Payer: Self-pay | Admitting: Family Medicine

## 2023-12-27 VITALS — BP 101/67 | HR 67 | Ht 62.5 in | Wt 201.8 lb

## 2023-12-27 DIAGNOSIS — G2581 Restless legs syndrome: Secondary | ICD-10-CM

## 2023-12-27 DIAGNOSIS — E66811 Obesity, class 1: Secondary | ICD-10-CM | POA: Diagnosis not present

## 2023-12-27 DIAGNOSIS — F5104 Psychophysiologic insomnia: Secondary | ICD-10-CM | POA: Diagnosis not present

## 2023-12-27 MED ORDER — ROPINIROLE HCL 0.5 MG PO TABS: ORAL_TABLET | ORAL | 1 refills | Status: AC

## 2023-12-27 NOTE — Assessment & Plan Note (Signed)
 Switching pregabalin to ropinirole.  Was having excessive grogginess when taking the pregabalin.

## 2023-12-27 NOTE — Progress Notes (Signed)
   Established Patient Office Visit  Subjective   Patient ID: Tina Kemp, female    DOB: 30-Jul-1976  Age: 48 y.o. MRN: [id number]  Chief Complaint  Patient presents with   Weight Check   Sleeping Problem    HPI  Weight-patient states she has had difficulty losing weight.  Not gaining weight , it has been stable over the past year but not able to keep her weight off if she loses weight.  Generally eats breakfast and lunch, sometimes skips dinner.  Her weakness is snacking on potato chips and drinking calories in the form of soda and sweet tea.  Patient still having issues with early awakening.  Does not have significant issues with falling asleep.  Takes the gabapentin but only takes it occasionally because it makes her feel groggy in the morning.  Has tried melatonin in the past did not work.  Has tried to try Tylenol PM and Unisom.  Did not help.  She has tried behavioral modifications including white noise machines, limiting distractions.  She does not do shift work.  Goes to the bed at the same time generally each night.  Also has issues with restless leg.  Has never tried ropinirole, only the gabapentin recently.    The 10-year ASCVD risk score (Arnett DK, et al., 2019) is: 3.7%  Health Maintenance Due  Topic Date Due   Pneumococcal Vaccine 52-36 Years old (1 of 2 - PCV) Never done   HIV Screening  Never done   Hepatitis C Screening  Never done   Cervical Cancer Screening (HPV/Pap Cotest)  01/07/2018   Colonoscopy  Never done   COVID-19 Vaccine (2 - Janssen risk series) 09/04/2020      Objective:     BP 101/67   Pulse 67   Ht 5' 2.5" (1.588 m)   Wt 201 lb 12.8 oz (91.5 kg)   SpO2 97%   BMI 36.32 kg/m    Physical Exam General: Alert, oriented Pulmonary: No respiratory distress Psych: Pleasant affect.    No results found for any visits on 12/27/23.      Assessment & Plan:   Restless legs syndrome (RLS) Assessment & Plan: Switching pregabalin to  ropinirole.  Was having excessive grogginess when taking the pregabalin.   Psychophysiological insomnia Assessment & Plan: Has tried and failed over-the-counter options including antihistamines, melatonin.  Has tried pregabalin but takes this only occasionally due to morning grogginess.  Discussed over-the-counter glycine 3 g an hour before bed and ramelteon.  Will hold off on ramelteon and see if the glycine helps.   Obesity, Class I, BMI 30-34.9 Assessment & Plan: Patient's weight has been stable over the past year.  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  Also snacking on chips, plain potato chips. - Download calorie counting app, keep track of her calorie intake over the next 2 weeks. - When I see her again we can discuss which foods we can eliminate or reduce and how we can adjust calories.   Other orders -     rOPINIRole HCl; Take a half tab for 3 days, then increase to 1 tablet  Dispense: 30 tablet; Refill: 1     Return in about 6 weeks (around 02/07/2024) for Sleep and weight.    Sandre Kitty, MD

## 2023-12-27 NOTE — Assessment & Plan Note (Signed)
 Patient's weight has been stable over the past year.  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  Also snacking on chips, plain potato chips. - Download calorie counting app, keep track of her calorie intake over the next 2 weeks. - When I see her again we can discuss which foods we can eliminate or reduce and how we can adjust calories.

## 2023-12-27 NOTE — Assessment & Plan Note (Signed)
 Has tried and failed over-the-counter options including antihistamines, melatonin.  Has tried pregabalin but takes this only occasionally due to morning grogginess.  Discussed over-the-counter glycine 3 g an hour before bed and ramelteon.  Will hold off on ramelteon and see if the glycine helps.

## 2023-12-27 NOTE — Patient Instructions (Signed)
 It was nice to see you today,  We addressed the following topics today: -I am sending in a medication called Requip for restless leg syndrome that may help with your sleep.  I would try taking this instead of your Lyrica.  Start with half a tablet for 3 days then increase to 1 tablet 1 hour before bedtime. - You can also try over-the-counter L glycine 3 g 30 minutes to an hour before bedtime.  This is an amino acid and is available at vitamin shops. - For your weight concerns I would like you to keep a food diary for the next 2 weeks and make it as accurate as possible.  Use phone apps such as lose it, my fitness pal, or cronometer.  After we get this baseline we can discuss adjusting your calories and identifying sources of high calorie intake.  Have a great day,  Frederic Jericho, MD

## 2023-12-28 ENCOUNTER — Encounter: Payer: Self-pay | Admitting: Family Medicine

## 2023-12-28 DIAGNOSIS — E66811 Obesity, class 1: Secondary | ICD-10-CM

## 2024-10-09 ENCOUNTER — Encounter: Payer: Self-pay | Admitting: Family Medicine

## 2024-10-10 ENCOUNTER — Other Ambulatory Visit: Payer: Self-pay | Admitting: Family Medicine

## 2024-10-10 DIAGNOSIS — Z1231 Encounter for screening mammogram for malignant neoplasm of breast: Secondary | ICD-10-CM

## 2024-10-15 ENCOUNTER — Inpatient Hospital Stay: Admission: RE | Admit: 2024-10-15 | Discharge: 2024-10-15 | Attending: Family Medicine

## 2024-10-15 DIAGNOSIS — Z1231 Encounter for screening mammogram for malignant neoplasm of breast: Secondary | ICD-10-CM

## 2024-10-19 ENCOUNTER — Ambulatory Visit (HOSPITAL_COMMUNITY)
Admission: EM | Admit: 2024-10-19 | Discharge: 2024-10-19 | Disposition: A | Discharge status: Home / Self Care | Attending: Neurology | Admitting: Neurology

## 2024-10-19 ENCOUNTER — Encounter (HOSPITAL_COMMUNITY): Payer: Self-pay | Admitting: Emergency Medicine

## 2024-10-19 ENCOUNTER — Ambulatory Visit (HOSPITAL_COMMUNITY)

## 2024-10-19 DIAGNOSIS — J329 Chronic sinusitis, unspecified: Secondary | ICD-10-CM

## 2024-10-19 DIAGNOSIS — J40 Bronchitis, not specified as acute or chronic: Secondary | ICD-10-CM | POA: Diagnosis not present

## 2024-10-19 DIAGNOSIS — R051 Acute cough: Secondary | ICD-10-CM

## 2024-10-19 LAB — POC COVID19/FLU A&B COMBO
Covid Antigen, POC: NEGATIVE
Influenza A Antigen, POC: NEGATIVE
Influenza B Antigen, POC: NEGATIVE

## 2024-10-19 MED ORDER — PREDNISONE 20 MG PO TABS: 40.0000 mg | ORAL_TABLET | Freq: Every day | ORAL | 0 refills | Status: AC

## 2024-10-19 MED ORDER — PROMETHAZINE-DM 6.25-15 MG/5ML PO SYRP: 5.0000 mL | ORAL_SOLUTION | Freq: Every evening | ORAL | 0 refills | Status: AC | PRN

## 2024-10-19 MED ORDER — AZITHROMYCIN 250 MG PO TABS: 250.0000 mg | ORAL_TABLET | Freq: Every day | ORAL | 0 refills | Status: AC

## 2024-10-19 MED ORDER — ALBUTEROL SULFATE HFA 108 (90 BASE) MCG/ACT IN AERS: 1.0000 | INHALATION_SPRAY | Freq: Four times a day (QID) | RESPIRATORY_TRACT | 0 refills | Status: AC | PRN

## 2024-10-19 NOTE — ED Provider Notes (Signed)
 " [facility]    CSN: [id number] Arrival date & time: 10/19/24  1210      History   Chief Complaint Chief Complaint  Patient presents with   Cough   Nasal Congestion    HPI Tina Kemp is a 48 y.o. female.   Tina Kemp presenting with a severe non productive cough for 3 days but feels like she should have a productive cough with how hard she has been coughing. She has had a lot of postnasal drip, rhinorrhea and congestion. She is taking an expectorant but not a cough suppressant. She does smoke cigarettes.  She denies nausea, vomiting, constipation.  She has been around who grandkids who have been sick/snotty.   The history is provided by the patient.  Cough Cough characteristics:  Non-productive Sputum characteristics:  Yellow Severity:  Severe Onset quality:  Gradual Duration:  3 days Timing:  Intermittent Progression:  Worsening Chronicity:  New Smoker: no   Context: sick contacts   Relieved by:  Cough suppressants Worsened by:  Exposure to cold air, environmental changes and activity Associated symptoms: rhinorrhea, shortness of breath, sinus congestion and sore throat     Past Medical History:  Diagnosis Date   Kidney stone     Patient Active Problem List   Diagnosis Date Noted   Psychophysiological insomnia 06/22/2023   Anxiety 06/22/2023   Restless legs syndrome (RLS) 06/22/2023   Hypercholesteremia 12/12/2022   Vitamin D  deficiency 06/02/2020   Fatigue 06/02/2020   Obesity, Class I, BMI 30-34.9 05/22/2018   Family history of diabetes mellitus in father 05/22/2018   Family history of lung cancer 05/22/2018   History of partial hysterectomy 05/22/2018   Breast cysts, right 05/22/2018   Tobacco abuse 05/22/2018   Tobacco abuse counseling 05/22/2018    Past Surgical History:  Procedure Laterality Date   ABDOMINAL HYSTERECTOMY     BREAST CYST EXCISION Left 2012   BREAST SURGERY Left    Left cysts removal   LITHOTRIPSY      OB  History   No obstetric history on file.      Home Medications    Prior to Admission medications  Medication Sig Start Date End Date Taking? Authorizing Provider  albuterol  (VENTOLIN  HFA) 108 (90 Base) MCG/ACT inhaler Inhale 1-2 puffs into the lungs every 6 (six) hours as needed for wheezing or shortness of breath. 10/19/24  Yes Dekendrick Uzelac, Jorene, NP  azithromycin  (ZITHROMAX ) 250 MG tablet Take 1 tablet (250 mg total) by mouth daily. Take first 2 tablets together, then 1 every day until finished. 10/19/24  Yes Remi Jorene, NP  predniSONE  (DELTASONE ) 20 MG tablet Take 2 tablets (40 mg total) by mouth daily with breakfast for 5 days. 10/19/24 10/24/24 Yes Remi Jorene, NP  promethazine -dextromethorphan (PROMETHAZINE -DM) 6.25-15 MG/5ML syrup Take 5 mLs by mouth at bedtime as needed for cough. 10/19/24  Yes Miral Hoopes, Jorene, NP  pregabalin  (LYRICA ) 75 MG capsule Take 1 (75 mg) by mouth 1 to 3 hours before bedtime daily. Can increase by 75 mg every 5-7 days to maximum of 4 tablets daily. 06/22/23   Wallace Joesph LABOR, PA  rOPINIRole  (REQUIP ) 0.5 MG tablet Take a half tab for 3 days, then increase to 1 tablet 12/27/23   Chandra Toribio POUR, MD    Family History Family History  Problem Relation Age of Onset   Alcohol abuse Mother    Cancer Mother        lung   Hypertension Mother    Hyperlipidemia  Father    Diabetes Father    Heart disease Paternal Grandmother     Social History Social History[1]   Allergies   Tramadol and Zofran  [ondansetron  hcl]   Review of Systems Review of Systems  HENT:  Positive for rhinorrhea and sore throat.   Respiratory:  Positive for cough and shortness of breath.      Physical Exam Triage Vital Signs ED Triage Vitals  Encounter Vitals Group     BP 10/19/24 1300 135/70     Girls Systolic BP Percentile --      Girls Diastolic BP Percentile --      Boys Systolic BP Percentile --      Boys Diastolic BP Percentile --      Pulse Rate 10/19/24 1300 89      Resp 10/19/24 1300 17     Temp 10/19/24 1300 98.2 F (36.8 C)     Temp Source 10/19/24 1300 Oral     SpO2 10/19/24 1300 98 %     Weight --      Height --      Head Circumference --      Peak Flow --      Pain Score 10/19/24 1259 0     Pain Loc --      Pain Education --      Exclude from Growth Chart --    No data found.  Updated Vital Signs BP 135/70 (BP Location: Left Arm)   Pulse 89   Temp 98.2 F (36.8 C) (Oral)   Resp 17   SpO2 98%   Visual Acuity Right Eye Distance:   Left Eye Distance:   Bilateral Distance:    Right Eye Near:   Left Eye Near:    Bilateral Near:     Physical Exam   UC Treatments / Results  Labs (all labs ordered are listed, but only abnormal results are displayed) Labs Reviewed  POC COVID19/FLU A&B COMBO    EKG   Radiology DG Chest 2 View Result Date: 10/19/2024 CLINICAL DATA:  Cough.  Congested. EXAM: CHEST - 2 VIEW COMPARISON:  11/03/2015 FINDINGS: The cardiomediastinal contours are normal. The lungs are clear. Pulmonary vasculature is normal. No consolidation, pleural effusion, or pneumothorax. No acute osseous abnormalities are seen. IMPRESSION: No active cardiopulmonary disease. Electronically Signed   By: Andrea Gasman M.D.   On: 10/19/2024 13:38    Procedures Procedures (including critical care time)  Medications Ordered in UC Medications - No data to display  Initial Impression / Assessment and Plan / UC Course  I have reviewed the triage vital signs and the nursing notes.  Pertinent labs & imaging results that were available during my care of the patient were reviewed by me and considered in my medical decision making (see chart for details).  Imaging:  No acute disease Viral Swab: Negative Exam consistent with sinobronchitis. Recommend prednisone , z-pack, tylenol , albuterol , cough medicine.  Return precautions discussed.  Patient in agreement with plan of care     Final Clinical Impressions(s) / UC Diagnoses    Final diagnoses:  Acute cough  Sinobronchitis     Discharge Instructions      Your evaluation shows you have a bacterial sinus infection plus a viral infection of the upper airways of your lungs. Use the following medicines to help with your symptoms: Covid/flu negative.  Take prednisone  steroid pills as prescribed starting 12/20.  2 pills- 40mg - once daily each morning with food/breakfast for 5 days Do not take ibuprofen/naproxen/other  NSAIDs while taking steroid pills as this can cause upset stomach.   Take antibiotic as prescribed each morning and each evening for 5 days with food.   Albuterol  inhaler 2 puffs every 4-6 hours as needed for cough, shortness of breath, and wheezing.  Over the counter guaifenesin  (mucinex ) as needed for cough/nasal congestion.   Promethazine  DM as needed for cough at bedtime- this medicine will make you sleepy so only use at nighttime.   If you develop any new or worsening symptoms or if your symptoms do not start to improve, please return here or follow-up with your primary care provider. If your symptoms are severe, please go to the emergency room.     ED Prescriptions     Medication Sig Dispense Auth. Provider   predniSONE  (DELTASONE ) 20 MG tablet Take 2 tablets (40 mg total) by mouth daily with breakfast for 5 days. 10 tablet Remi Pippin, NP   azithromycin  (ZITHROMAX ) 250 MG tablet Take 1 tablet (250 mg total) by mouth daily. Take first 2 tablets together, then 1 every day until finished. 6 tablet Remi Pippin, NP   promethazine -dextromethorphan (PROMETHAZINE -DM) 6.25-15 MG/5ML syrup Take 5 mLs by mouth at bedtime as needed for cough. 118 mL Remi Pippin, NP   albuterol  (VENTOLIN  HFA) 108 (90 Base) MCG/ACT inhaler Inhale 1-2 puffs into the lungs every 6 (six) hours as needed for wheezing or shortness of breath. 18 g Remi Pippin, NP      PDMP not reviewed this encounter.     [1]  Social History Tobacco Use   Smoking status:  Every Day    Current packs/day: 0.50    Average packs/day: 0.5 packs/day for 9.0 years (4.5 ttl pk-yrs)    Types: Cigarettes    Passive exposure: Past   Smokeless tobacco: Never  Vaping Use   Vaping status: Never Used  Substance Use Topics   Alcohol use: No   Drug use: No     Remi Pippin, NP 10/19/24 1344  "

## 2024-10-19 NOTE — Discharge Instructions (Addendum)
 Your evaluation shows you have a bacterial sinus infection plus a viral infection of the upper airways of your lungs. Use the following medicines to help with your symptoms: Covid/flu negative.  Take prednisone  steroid pills as prescribed starting 12/20.  2 pills- 40mg - once daily each morning with food/breakfast for 5 days Do not take ibuprofen/naproxen/other NSAIDs while taking steroid pills as this can cause upset stomach.   Take antibiotic as prescribed each morning and each evening for 5 days with food.   Albuterol  inhaler 2 puffs every 4-6 hours as needed for cough, shortness of breath, and wheezing.  Over the counter guaifenesin  (mucinex ) as needed for cough/nasal congestion.   Promethazine  DM as needed for cough at bedtime- this medicine will make you sleepy so only use at nighttime.   If you develop any new or worsening symptoms or if your symptoms do not start to improve, please return here or follow-up with your primary care provider. If your symptoms are severe, please go to the emergency room.

## 2024-10-19 NOTE — ED Triage Notes (Signed)
 Pt reports had cough that is congested but doesn't get anything up. Taking cough medication. Pt reports has nasal congestion as well.
